# Patient Record
Sex: Female | Born: 1960
Health system: Southern US, Community
[De-identification: ages and names within clinical notes are randomized; demographics above are authoritative.]

## PROBLEM LIST (undated history)

## (undated) DIAGNOSIS — D869 Sarcoidosis, unspecified: Secondary | ICD-10-CM

## (undated) DIAGNOSIS — E039 Hypothyroidism, unspecified: Secondary | ICD-10-CM

## (undated) DIAGNOSIS — R001 Bradycardia, unspecified: Secondary | ICD-10-CM

## (undated) DIAGNOSIS — M1990 Unspecified osteoarthritis, unspecified site: Secondary | ICD-10-CM

## (undated) DIAGNOSIS — E1129 Type 2 diabetes mellitus with other diabetic kidney complication: Secondary | ICD-10-CM

## (undated) DIAGNOSIS — E7800 Pure hypercholesterolemia, unspecified: Secondary | ICD-10-CM

## (undated) DIAGNOSIS — G4730 Sleep apnea, unspecified: Secondary | ICD-10-CM

## (undated) DIAGNOSIS — R519 Headache, unspecified: Secondary | ICD-10-CM

## (undated) DIAGNOSIS — K219 Gastro-esophageal reflux disease without esophagitis: Secondary | ICD-10-CM

## (undated) DIAGNOSIS — E785 Hyperlipidemia, unspecified: Secondary | ICD-10-CM

## (undated) DIAGNOSIS — F32A Depression, unspecified: Secondary | ICD-10-CM

## (undated) DIAGNOSIS — D649 Anemia, unspecified: Secondary | ICD-10-CM

## (undated) DIAGNOSIS — E669 Obesity, unspecified: Secondary | ICD-10-CM

## (undated) DIAGNOSIS — J45909 Unspecified asthma, uncomplicated: Secondary | ICD-10-CM

## (undated) DIAGNOSIS — I10 Essential (primary) hypertension: Secondary | ICD-10-CM

## (undated) DIAGNOSIS — N19 Unspecified kidney failure: Secondary | ICD-10-CM

## (undated) DIAGNOSIS — J449 Chronic obstructive pulmonary disease, unspecified: Secondary | ICD-10-CM

## (undated) DIAGNOSIS — D631 Anemia in chronic kidney disease: Secondary | ICD-10-CM

## (undated) DIAGNOSIS — R011 Cardiac murmur, unspecified: Secondary | ICD-10-CM

## (undated) DIAGNOSIS — E119 Type 2 diabetes mellitus without complications: Secondary | ICD-10-CM

## (undated) DIAGNOSIS — N189 Chronic kidney disease, unspecified: Secondary | ICD-10-CM

## (undated) HISTORY — DX: Anemia in chronic kidney disease: N18.9

## (undated) HISTORY — DX: Bradycardia, unspecified: R00.1

## (undated) HISTORY — DX: Hypothyroidism, unspecified: E03.9

## (undated) HISTORY — DX: Unspecified kidney failure: N19

## (undated) HISTORY — DX: Depression, unspecified: F32.A

## (undated) HISTORY — DX: Obesity, unspecified: E66.9

## (undated) HISTORY — DX: Unspecified asthma, uncomplicated: J45.909

## (undated) HISTORY — DX: Headache, unspecified: R51.9

## (undated) HISTORY — DX: Type 2 diabetes mellitus with other diabetic kidney complication: E11.29

## (undated) HISTORY — DX: Hyperlipidemia, unspecified: E78.5

## (undated) HISTORY — DX: Sleep apnea, unspecified: G47.30

## (undated) HISTORY — DX: Anemia in chronic kidney disease: D63.1

## (undated) HISTORY — DX: Cardiac murmur, unspecified: R01.1

## (undated) HISTORY — DX: Type 2 diabetes mellitus without complications: E11.9

## (undated) HISTORY — DX: Chronic obstructive pulmonary disease, unspecified: J44.9

## (undated) HISTORY — PX: CHOLECYSTECTOMY: SHX55

## (undated) HISTORY — PX: DILATION AND CURETTAGE OF UTERUS: SHX78

---

## 2001-03-19 ENCOUNTER — Encounter: Payer: Self-pay | Admitting: Internal Medicine

## 2001-03-19 ENCOUNTER — Emergency Department (HOSPITAL_COMMUNITY): Admission: EM | Admit: 2001-03-19 | Discharge: 2001-03-19 | Payer: Self-pay | Admitting: Emergency Medicine

## 2009-04-03 ENCOUNTER — Ambulatory Visit: Payer: Self-pay | Admitting: Thoracic Surgery

## 2009-04-09 ENCOUNTER — Encounter: Payer: Self-pay | Admitting: Thoracic Surgery

## 2009-04-09 ENCOUNTER — Ambulatory Visit: Payer: Self-pay | Admitting: Thoracic Surgery

## 2009-04-09 ENCOUNTER — Ambulatory Visit (HOSPITAL_COMMUNITY): Admission: RE | Admit: 2009-04-09 | Discharge: 2009-04-09 | Payer: Self-pay | Admitting: Thoracic Surgery

## 2009-04-10 ENCOUNTER — Ambulatory Visit: Payer: Self-pay | Admitting: Thoracic Surgery

## 2009-05-01 ENCOUNTER — Ambulatory Visit: Payer: Self-pay | Admitting: Thoracic Surgery

## 2009-10-01 ENCOUNTER — Ambulatory Visit (HOSPITAL_COMMUNITY): Admission: RE | Admit: 2009-10-01 | Discharge: 2009-10-01 | Payer: Self-pay | Admitting: Specialist

## 2010-09-17 LAB — GLUCOSE, CAPILLARY: Glucose-Capillary: 104 mg/dL — ABNORMAL HIGH (ref 70–99)

## 2010-10-02 LAB — COMPREHENSIVE METABOLIC PANEL
AST: 24 U/L (ref 0–37)
Albumin: 3.3 g/dL — ABNORMAL LOW (ref 3.5–5.2)
BUN: 10 mg/dL (ref 6–23)
Chloride: 101 mEq/L (ref 96–112)
Creatinine, Ser: 0.73 mg/dL (ref 0.4–1.2)
GFR calc Af Amer: >60 mL/min (ref >60)
Total Protein: 6.7 g/dL (ref 6.0–8.3)

## 2010-10-02 LAB — CBC
HCT: 38.5 % (ref 36.0–46.0)
Hemoglobin: 12.9 g/dL (ref 12.0–15.0)
MCHC: 33.5 g/dL (ref 30.0–36.0)
MCV: 87.6 fL (ref 78.0–100.0)
Platelets: 261 10*3/uL (ref 150–400)
RBC: 4.4 MIL/uL (ref 3.87–5.11)
RDW: 15 % (ref 11.5–15.5)
WBC: 7.3 10*3/uL (ref 4.0–10.5)

## 2010-10-02 LAB — TYPE AND SCREEN
ABO/RH(D): O POS
Antibody Screen: NEGATIVE

## 2010-10-02 LAB — FUNGUS CULTURE W SMEAR: Fungal Smear: NONE SEEN

## 2010-10-02 LAB — ABO/RH: ABO/RH(D): O POS

## 2010-10-02 LAB — AFB CULTURE WITH SMEAR (NOT AT ARMC)

## 2010-10-02 LAB — PROTIME-INR: INR: 0.94 (ref 0.00–1.49)

## 2010-10-02 LAB — APTT: aPTT: 28 seconds (ref 24–37)

## 2010-10-02 LAB — GLUCOSE, CAPILLARY
Glucose-Capillary: 104 mg/dL — ABNORMAL HIGH (ref 70–99)
Glucose-Capillary: 119 mg/dL — ABNORMAL HIGH (ref 70–99)

## 2010-10-02 LAB — CULTURE, RESPIRATORY W GRAM STAIN

## 2010-11-11 NOTE — Letter (Signed)
May 01, 2009   Tanvir A. Chodri, MD  [address] [place] [address]   Re:  GLADA, WICKSTROM                DOB:  12-18-1960   Dear Dr. Blenda Nicely:   I saw the patient back today.  Her mediastinoscopy site is now well  healed.  Her blood pressure was 130/80, pulse 85, respirations 18, and  sats were 98% on 2 L.  She has been started on oxygen by you.  Her  biopsy, as you know, showed sarcoidosis and we gave her a copy of her  path report.  I will be happy to see her again if she needs any further  biopsies.   Sincerely,   Ines Bloomer, M.D.  Electronically Signed   DPB/MEDQ  D:  05/01/2009  T:  05/02/2009  Job:  [id number]

## 2010-11-11 NOTE — Letter (Signed)
April 10, 2009   Tanvir A. Chodri, MD  [address], [place] [address]   Re:  JESICCA, DIPIERRO                DOB:  26-Jun-1961   Dear Dr. Blenda Nicely;   I saw the patient back today after her bronchoscopy, endobronchial  ultrasound, and mediastinoscopy.  We did multiple biopsies and some of  the lymph nodes were completely did show sinus histiocytosis with  several showed noncaseating granuloma, so I feel she does have sarcoid.  Her mediastinoscopy incision was healing well with some swelling, and I  told her to see you regarding treatment, and I will see her back again  in 3 weeks to check on the healing of the mediastinoscopy site.  Her  blood pressure is 120/80, pulse 92, respirations 18, sats were 93%.   Ines Bloomer, M.D.  Electronically Signed   DPB/MEDQ  D:  04/10/2009  T:  04/11/2009  Job:  [id number]

## 2010-11-11 NOTE — Letter (Signed)
April 03, 2009   Tanvir A. Chodri, MD  [address], [place] [address]   Re:  Mackenzie Johnson, Mackenzie Johnson                DOB:  25-Aug-1960   Dear Dr. Blenda Nicely:   I appreciate the opportunity of seeing the patient.  This 50 year old  patient was found to have some mediastinal, but particularly bilateral  hilar adenopathy.  She also has some pulmonary nodules in the left lower  lobe that are nonspecific and bilateral lower lobe bronchiectasis.  She  has had multiple pulmonary problems and was in [facility] for 2 weeks mainly because of pulmonary problems.  She also has  probably obstructive sleep apnea.  She has only smoked for 40 years.  She is referred here for her pulmonary adenopathy.  Her pulmonary  function shows an FVC of 1.75 with an FEV-1 of 1.52 and a diffusion  capacity of 65%.   PAST MEDICAL HISTORY:   ALLERGIES:  She is allergic to sulfa drugs.   MEDICATIONS:  Patanase nasal spray, Qvar, ReQuip, metformin, aspirin,  Crestor, Nexium, fish oil, citalopram, clonazepam, hydrocodone, and  Phenergan as well as being on CPAP.   She has diabetes mellitus type 2, arthritis, hypercholesterolemia,  chronic back pain, anxiety, depression, and GERD; and cholecystectomy.   FAMILY HISTORY:  Positive for diabetes, cancer, cardiac disease, and  hypertension.   SOCIAL HISTORY:  She is single.  She quit smoking.  She only smoked for  4 years.  Does not drink alcohol.   REVIEW OF SYSTEMS:  GENERAL:  She is 226 pounds.  She is 5 feet 4  inches, weight has been stable.  CARDIAC:  No angina or atrial fibrillation, but she has got shortness of  breath.  PULMONARY:  Shortness of breath and cough.  No hemoptysis.  GI:  GERD.  GU:  No kidney disease, dysuria, or frequent urination.  VASCULAR:  No claudication, DVT, or TIAs, but she has pain in her legs  with walking.  NEUROLOGIC:  No dizziness, headaches, blackouts, or seizures.  MUSCULOSKELETAL:  Arthritis.  PSYCHIATRIC:   Depression and nervousness.  EYE/ENT:  No change in eyesight or hearing.  HEMATOLOGIC:  No problems with bleeding, clotting disorders, or anemia.   PHYSICAL EXAMINATION:  General:  She is an obese Caucasian female in no  acute distress.  Vital Signs:  Her blood pressure was 128/80, pulse 80,  respirations 18, and sats were 96%.  Head, Eyes, Ears, Nose and Throat:  Unremarkable.  Neck:  Supple without thyromegaly.  There is no  supraclavicular or axillary adenopathy.  Chest:  Clear to auscultation  and percussion.  Heart:  Regular sinus rhythm.  No murmurs.  Abdomen:  Obese.  Bowel sounds are normal.  Extremities:  Pulses are 2+.  There is  no clubbing or edema.  Neurologic:  She is oriented x3.  Sensory and  motor intact.  Cranial nerves intact.   I think, she probably does have the high possibility of having sarcoid.  I plan to do a bronchoscopy with endobronchial ultrasound and possibly  mediastinoscopy on her and we have tentatively scheduled this on the  13th at [facility].  We will let you know our findings.  I appreciate  the opportunity of seeing the patient.   Sincerely,   Ines Bloomer, M.D.  Electronically Signed   DPB/MEDQ  D:  04/03/2009  T:  04/04/2009  Job:  [id number]

## 2011-09-24 ENCOUNTER — Emergency Department (HOSPITAL_COMMUNITY)
Admission: EM | Admit: 2011-09-24 | Discharge: 2011-09-24 | Disposition: A | Discharge status: Home / Self Care | Payer: Medicare Other | Attending: Emergency Medicine | Admitting: Emergency Medicine

## 2011-09-24 ENCOUNTER — Encounter (HOSPITAL_COMMUNITY): Payer: Self-pay | Admitting: *Deleted

## 2011-09-24 ENCOUNTER — Emergency Department (HOSPITAL_COMMUNITY): Payer: Medicare Other

## 2011-09-24 DIAGNOSIS — J45909 Unspecified asthma, uncomplicated: Secondary | ICD-10-CM | POA: Insufficient documentation

## 2011-09-24 DIAGNOSIS — M25559 Pain in unspecified hip: Secondary | ICD-10-CM | POA: Insufficient documentation

## 2011-09-24 DIAGNOSIS — E119 Type 2 diabetes mellitus without complications: Secondary | ICD-10-CM | POA: Insufficient documentation

## 2011-09-24 DIAGNOSIS — IMO0001 Reserved for inherently not codable concepts without codable children: Secondary | ICD-10-CM | POA: Insufficient documentation

## 2011-09-24 DIAGNOSIS — M25551 Pain in right hip: Secondary | ICD-10-CM

## 2011-09-24 DIAGNOSIS — I10 Essential (primary) hypertension: Secondary | ICD-10-CM | POA: Insufficient documentation

## 2011-09-24 DIAGNOSIS — Z794 Long term (current) use of insulin: Secondary | ICD-10-CM | POA: Insufficient documentation

## 2011-09-24 HISTORY — DX: Pure hypercholesterolemia, unspecified: E78.00

## 2011-09-24 HISTORY — DX: Sarcoidosis, unspecified: D86.9

## 2011-09-24 HISTORY — DX: Essential (primary) hypertension: I10

## 2011-09-24 HISTORY — DX: Gastro-esophageal reflux disease without esophagitis: K21.9

## 2011-09-24 HISTORY — DX: Unspecified osteoarthritis, unspecified site: M19.90

## 2011-09-24 MED ORDER — HYDROCODONE-ACETAMINOPHEN 5-325 MG PO TABS
2.0000 | ORAL_TABLET | Freq: Once | ORAL | Status: AC
  Administered 2011-09-24: 2 via ORAL
  Filled 2011-09-24: qty 2

## 2011-09-24 MED ORDER — HYDROCODONE-ACETAMINOPHEN 5-325 MG PO TABS: 2.0000 | ORAL_TABLET | ORAL | Status: AC | PRN

## 2011-09-24 NOTE — ED Notes (Signed)
Made high fall risk d/t multiple falls at home in last month, pt reports: unsteady on feet, sometimes uses walker at home, and obesity with current hip & leg pain.

## 2011-09-24 NOTE — ED Notes (Signed)
Patient presents to ed c/o right hip pain  States it woke her up 3am today, states she "popped" right hip 1 week ago however was able to ambulate, states her hips hurt her at times and she uses a cane to walk, when they are hurting. Patient is on 4L/O2 at home at all times. Patient was able to ambulate from wheelchair to stretcher with little assistance. Family at bedside.

## 2011-09-24 NOTE — ED Notes (Signed)
Patient returned from xray.

## 2011-09-24 NOTE — ED Provider Notes (Signed)
History     CSN: [id number]  Arrival date & time 09/24/11  8295   First MD Initiated Contact with Patient 09/24/11 [phone]      Chief Complaint  Patient presents with  . Hip Pain    (Consider location/radiation/quality/duration/timing/severity/associated sxs/prior treatment) Patient is a 51 y.o. female presenting with hip pain. The history is provided by the patient. No language interpreter was used.  Hip Pain This is a new problem. The current episode started in the past 7 days. The problem occurs constantly. The problem has been gradually worsening. Associated symptoms include myalgias. Pertinent negatives include no joint swelling. The symptoms are aggravated by walking. She has tried rest for the symptoms. The treatment provided no relief.  Pt reports she feels like her right hip is popping in and out of joint.  Pt complains of pain.    Past Medical History  Diagnosis Date  . Diabetes mellitus   . Hypertension   . Hypercholesteremia   . GERD (gastroesophageal reflux disease)   . Sarcoidosis   . Asthma   . Arthritis     Past Surgical History  Procedure Date  . Cholecystectomy     No family history on file.  History  Substance Use Topics  . Smoking status: Never Smoker   . Smokeless tobacco: Not on file  . Alcohol Use: No    OB History    Grav Para Term Preterm Abortions TAB SAB Ect Mult Living                  Review of Systems  Musculoskeletal: Positive for myalgias and gait problem. Negative for joint swelling.  All other systems reviewed and are negative.    Allergies  Review of patient's allergies indicates no known allergies.  Home Medications   Current Outpatient Rx  Name Route Sig Dispense Refill  . ALBUTEROL SULFATE (2.5 MG/3ML) 0.083% IN NEBU Nebulization Take 2.5 mg by nebulization 2 (two) times daily as needed. Usually uses at bedtime, occasionally has a treatment during the day    . ALPRAZOLAM 1 MG PO TABS Oral Take 3 mg by mouth at bedtime  as needed. anxiety    . BUDESONIDE-FORMOTEROL FUMARATE 160-4.5 MCG/ACT IN AERO Inhalation Inhale 2 puffs into the lungs daily.    . ERGOCALCIFEROL 50000 UNITS PO CAPS Oral Take 50,000 Units by mouth once a week. wednesday    . ESOMEPRAZOLE MAGNESIUM 40 MG PO CPDR Oral Take 40 mg by mouth daily after lunch daily after lunch.    . FUROSEMIDE 40 MG PO TABS Oral Take 40 mg by mouth daily.    . INSULIN GLARGINE 100 UNIT/ML [place] SOLN Subcutaneous Inject 50 Units into the skin at bedtime.    Marland Kitchen LISINOPRIL 20 MG PO TABS Oral Take 20 mg by mouth daily.    Marland Kitchen METFORMIN HCL 1000 MG PO TABS Oral Take 1,000 mg by mouth 2 (two) times daily with a meal.    . OLOPATADINE HCL 0.6 % NA SOLN Each Nare Place 1 puff into both nostrils daily.    Marland Kitchen PREDNISONE 20 MG PO TABS Oral Take 20 mg by mouth 2 (two) times daily.    Marland Kitchen ROPINIROLE HCL 5 MG PO TABS Oral Take 5 mg by mouth at bedtime.    Marland Kitchen ROSUVASTATIN CALCIUM 20 MG PO TABS Oral Take 20 mg by mouth daily.    . SERTRALINE HCL 100 MG PO TABS Oral Take 100 mg by mouth 2 (two) times daily.  BP 98/57  Pulse 82  Temp(Src) 98 F (36.7 C) (Oral)  Resp 20  SpO2 99%  Physical Exam  Nursing note and vitals reviewed. Constitutional: She is oriented to person, place, and time. She appears well-developed and well-nourished.  HENT:  Head: Normocephalic and atraumatic.  Eyes: Pupils are equal, round, and reactive to light.  Neck: Normal range of motion.  Cardiovascular: Normal rate and normal heart sounds.   Pulmonary/Chest: Effort normal and breath sounds normal.  Abdominal: Soft.  Musculoskeletal: She exhibits tenderness.  Neurological: She is alert and oriented to person, place, and time. She has normal reflexes.  Skin: Skin is warm and dry.  Psychiatric: She has a normal mood and affect.    ED Course  Procedures (including critical care time)  Labs Reviewed - No data to display Dg Hip Complete Right  09/24/2011  *RADIOLOGY REPORT*  Clinical Data: Chronic  posterior and lateral right hip pain; multiple recent falls.  RIGHT HIP - COMPLETE 2+ VIEW  Comparison: None.  Findings: There is no evidence of fracture or dislocation.  Both femoral heads are seated normally within their respective acetabula.  The proximal right femur appears intact.  No significant degenerative change is appreciated.  Mild sclerotic change is noted at the sacroiliac joints.  The visualized bowel gas pattern is grossly unremarkable in appearance.  IMPRESSION: No evidence of fracture or dislocation.  Original Report Authenticated By: Tonia Ghent, M.D.     No diagnosis found.    MDM  Pt given 2 vicodin.   Xrays no significant degenerative changes.  I advised pt to follow up with Dr. Dion Saucier Orthopaedist for evaluation of hip pain.  Pt given rx for vicodin       Lonia Skinner [place], [place] 09/24/11 [phone]

## 2011-09-24 NOTE — ED Notes (Signed)
Patient transported to X-ray 

## 2011-09-24 NOTE — ED Notes (Signed)
C/o R hip pain, also R leg pain, describes as numb cool and tingly, reports knee & hip have popped out of place and back in to place, h/o similar. Sitting in w/c rates pain 9-10/10. Pt on home O2.

## 2011-09-24 NOTE — Discharge Instructions (Signed)
Arthralgia Arthralgia is joint pain. A joint is a place where two bones meet. Joint pain can happen for many reasons. The joint can be bruised, stiff, infected, or weak from aging. Pain usually goes away after resting and taking medicine for soreness.  HOME CARE  Rest the joint as told by your doctor.   Keep the sore joint raised (elevated) for the first 24 hours.   Put ice on the joint area.   Put ice in a plastic bag.   Place a towel between your skin and the bag.   Leave the ice on for 15 to 20 minutes, 3 to 4 times a day.   Wear your splint, casting, elastic bandage, or sling as told by your doctor.   Only take medicine as told by your doctor. Do not take aspirin.   Use crutches as told by your doctor. Do not put weight on the joint until told to by your doctor.  GET HELP RIGHT AWAY IF:   You have bruising, puffiness (swelling), or more pain.   Your fingers or toes turn blue or start to lose feeling (numb).   Your medicine does not lessen the pain.   Your pain becomes severe.   You have a temperature by mouth above 102 F (38.9 C), not controlled by medicine.   You cannot move or use the joint.  MAKE SURE YOU:   Understand these instructions.   Will watch your condition.   Will get help right away if you are not doing well or get worse.  Document Released: 06/03/2009 Document Revised: 06/04/2011 Document Reviewed: 06/03/2009 [employer] Patient Information 2012 [employer]. 

## 2011-09-24 NOTE — ED Provider Notes (Signed)
Medical screening examination/treatment/procedure(s) were performed by non-physician practitioner and as supervising physician I was immediately available for consultation/collaboration.   Hanley Seamen, MD 09/24/11 [phone]

## 2012-01-15 ENCOUNTER — Encounter (HOSPITAL_COMMUNITY): Payer: Self-pay | Admitting: *Deleted

## 2012-01-15 ENCOUNTER — Emergency Department (HOSPITAL_COMMUNITY)
Admission: EM | Admit: 2012-01-15 | Discharge: 2012-01-15 | Disposition: A | Discharge status: Home / Self Care | Payer: Medicare Other | Attending: Emergency Medicine | Admitting: Emergency Medicine

## 2012-01-15 DIAGNOSIS — Z79899 Other long term (current) drug therapy: Secondary | ICD-10-CM | POA: Insufficient documentation

## 2012-01-15 DIAGNOSIS — D649 Anemia, unspecified: Secondary | ICD-10-CM

## 2012-01-15 DIAGNOSIS — K219 Gastro-esophageal reflux disease without esophagitis: Secondary | ICD-10-CM | POA: Insufficient documentation

## 2012-01-15 DIAGNOSIS — I10 Essential (primary) hypertension: Secondary | ICD-10-CM | POA: Insufficient documentation

## 2012-01-15 DIAGNOSIS — Z794 Long term (current) use of insulin: Secondary | ICD-10-CM | POA: Insufficient documentation

## 2012-01-15 DIAGNOSIS — D869 Sarcoidosis, unspecified: Secondary | ICD-10-CM | POA: Insufficient documentation

## 2012-01-15 DIAGNOSIS — N289 Disorder of kidney and ureter, unspecified: Secondary | ICD-10-CM

## 2012-01-15 DIAGNOSIS — E7800 Pure hypercholesterolemia, unspecified: Secondary | ICD-10-CM | POA: Insufficient documentation

## 2012-01-15 DIAGNOSIS — M129 Arthropathy, unspecified: Secondary | ICD-10-CM | POA: Insufficient documentation

## 2012-01-15 DIAGNOSIS — E669 Obesity, unspecified: Secondary | ICD-10-CM | POA: Insufficient documentation

## 2012-01-15 DIAGNOSIS — N3090 Cystitis, unspecified without hematuria: Secondary | ICD-10-CM

## 2012-01-15 DIAGNOSIS — E119 Type 2 diabetes mellitus without complications: Secondary | ICD-10-CM | POA: Insufficient documentation

## 2012-01-15 HISTORY — DX: Anemia, unspecified: D64.9

## 2012-01-15 LAB — POCT I-STAT, CHEM 8
Calcium, Ion: 1.27 mmol/L — ABNORMAL HIGH (ref 1.12–1.23)
Chloride: 110 mEq/L (ref 96–112)
Creatinine, Ser: 2.2 mg/dL — ABNORMAL HIGH (ref 0.50–1.10)
Glucose, Bld: 112 mg/dL — ABNORMAL HIGH (ref 70–99)
Hemoglobin: 9.5 g/dL — ABNORMAL LOW (ref 12.0–15.0)
Potassium: 4.9 mEq/L (ref 3.5–5.1)

## 2012-01-15 LAB — URINALYSIS, ROUTINE W REFLEX MICROSCOPIC
Ketones, ur: NEGATIVE mg/dL
Nitrite: NEGATIVE
pH: 5.5 (ref 5.0–8.0)

## 2012-01-15 LAB — URINE MICROSCOPIC-ADD ON

## 2012-01-15 MED ORDER — HYDROCODONE-ACETAMINOPHEN 5-325 MG PO TABS: ORAL_TABLET | ORAL | Status: AC

## 2012-01-15 MED ORDER — SODIUM CHLORIDE 0.9 % IV BOLUS (SEPSIS)
1000.0000 mL | Freq: Once | INTRAVENOUS | Status: AC
  Administered 2012-01-15: 1000 mL via INTRAVENOUS

## 2012-01-15 MED ORDER — DEXTROSE 5 % IV SOLN
1.0000 g | Freq: Once | INTRAVENOUS | Status: AC
  Administered 2012-01-15: 1 g via INTRAVENOUS
  Filled 2012-01-15: qty 10

## 2012-01-15 MED ORDER — CEPHALEXIN 500 MG PO CAPS: 500.0000 mg | ORAL_CAPSULE | Freq: Three times a day (TID) | ORAL | Status: AC

## 2012-01-15 NOTE — ED Notes (Signed)
[place], [place] notified of abnormal lab test results 

## 2012-01-15 NOTE — ED Provider Notes (Signed)
History     CSN: [id number]  Arrival date & time 01/15/12  4098   First MD Initiated Contact with Patient 01/15/12 [phone]      Chief Complaint  Patient presents with  . Urinary Frequency  . Flank Pain    (Consider location/radiation/quality/duration/timing/severity/associated sxs/prior treatment) HPI Comments: Patient presents with the chief complaint of urinary frequency, dysuria, and left flank pain that began approximately yesterday around noon. She states that when she was urinating last evening she felt a 'pop' and thinks that her bladder or kidney has 'popped'. She states she has to urinate every several minutes and thinks that she has an infection. She has nausea but no vomiting. She denies fever, chills, URI symptoms, chest pain, shortness of breath, vaginal bleeding or discharge, change in bowel movements, or tremor he swelling. Nothing makes the symptoms better. Urination makes the symptoms worse. Onset was gradual. Course is constant.  Patient is a 51 y.o. female presenting with frequency. The history is provided by the patient.  Urinary Frequency This is a new problem. The current episode started yesterday. The problem has been unchanged. Associated symptoms include nausea. Pertinent negatives include no abdominal pain, chest pain, coughing, fever, headaches, myalgias, rash, sore throat or vomiting. Nothing aggravates the symptoms. She has tried nothing for the symptoms. The treatment provided no relief.    Past Medical History  Diagnosis Date  . Diabetes mellitus   . Hypertension   . Hypercholesteremia   . GERD (gastroesophageal reflux disease)   . Sarcoidosis   . Asthma   . Arthritis   . Anemia     Past Surgical History  Procedure Date  . Cholecystectomy     History reviewed. No pertinent family history.  History  Substance Use Topics  . Smoking status: Never Smoker   . Smokeless tobacco: Not on file  . Alcohol Use: No    OB History    Grav Para Term  Preterm Abortions TAB SAB Ect Mult Living                  Review of Systems  Constitutional: Negative for fever.  HENT: Negative for sore throat and rhinorrhea.   Eyes: Negative for redness.  Respiratory: Negative for cough.   Cardiovascular: Negative for chest pain.  Gastrointestinal: Positive for nausea. Negative for vomiting, abdominal pain and diarrhea.  Genitourinary: Positive for dysuria, frequency and flank pain. Negative for decreased urine volume, vaginal bleeding and vaginal discharge.  Musculoskeletal: Negative for myalgias.  Skin: Negative for rash.  Neurological: Negative for headaches.    Allergies  Sulfa antibiotics  Home Medications   Current Outpatient Rx  Name Route Sig Dispense Refill  . ALBUTEROL SULFATE (2.5 MG/3ML) 0.083% IN NEBU Nebulization Take 2.5 mg by nebulization 2 (two) times daily as needed. Usually uses at bedtime, occasionally has a treatment during the day    . ALPRAZOLAM 1 MG PO TABS Oral Take 3 mg by mouth at bedtime as needed. anxiety    . BUDESONIDE-FORMOTEROL FUMARATE 160-4.5 MCG/ACT IN AERO Inhalation Inhale 2 puffs into the lungs daily.    . ERGOCALCIFEROL 50000 UNITS PO CAPS Oral Take 50,000 Units by mouth once a week. wednesday    . ESOMEPRAZOLE MAGNESIUM 40 MG PO CPDR Oral Take 40 mg by mouth daily after lunch daily after lunch.    . FUROSEMIDE 40 MG PO TABS Oral Take 40 mg by mouth daily.    . INSULIN GLARGINE 100 UNIT/ML [place] SOLN Subcutaneous Inject 50 Units into the  skin at bedtime.    Marland Kitchen LISINOPRIL 20 MG PO TABS Oral Take 20 mg by mouth daily.    Marland Kitchen METFORMIN HCL 1000 MG PO TABS Oral Take 1,000 mg by mouth 2 (two) times daily with a meal.    . OLOPATADINE HCL 0.6 % NA SOLN Each Nare Place 1 puff into both nostrils daily.    Marland Kitchen PREDNISONE 20 MG PO TABS Oral Take 20 mg by mouth 2 (two) times daily.    Marland Kitchen ROPINIROLE HCL 5 MG PO TABS Oral Take 5 mg by mouth at bedtime.    Marland Kitchen ROSUVASTATIN CALCIUM 20 MG PO TABS Oral Take 20 mg by mouth daily.     . SERTRALINE HCL 100 MG PO TABS Oral Take 100 mg by mouth 2 (two) times daily.      BP 114/47  Pulse 101  Temp 98.8 F (37.1 C) (Oral)  Resp 20  SpO2 98%  Physical Exam  Nursing note and vitals reviewed. Constitutional: She appears well-developed and well-nourished.  HENT:  Head: Normocephalic and atraumatic.  Eyes: Conjunctivae are normal. Right eye exhibits no discharge. Left eye exhibits no discharge.  Neck: Normal range of motion. Neck supple.  Cardiovascular: Normal rate and regular rhythm.   Murmur (systolic) heard. Pulmonary/Chest: Effort normal and breath sounds normal. No respiratory distress.  Abdominal: Soft. There is tenderness in the suprapubic area. There is no rigidity, no rebound, no guarding, no CVA tenderness, no tenderness at McBurney's point and negative Murphy's sign.         Obese, exam limited by habitus  Neurological: She is alert.  Skin: Skin is warm and dry.  Psychiatric: She has a normal mood and affect.    ED Course  Procedures (including critical care time)  Labs Reviewed  URINALYSIS, ROUTINE W REFLEX MICROSCOPIC - Abnormal; Notable for the following:    APPearance HAZY (*)     Hgb urine dipstick MODERATE (*)     Protein, ur 30 (*)     Leukocytes, UA MODERATE (*)     All other components within normal limits  URINE MICROSCOPIC-ADD ON - Abnormal; Notable for the following:    Squamous Epithelial / LPF FEW (*)     Bacteria, UA FEW (*)     Casts HYALINE CASTS (*)  GRANULAR CAST   All other components within normal limits  POCT I-STAT, CHEM 8 - Abnormal; Notable for the following:    BUN 46 (*)     Creatinine, Ser 2.20 (*)     Glucose, Bld 112 (*)     Calcium, Ion 1.27 (*)     Hemoglobin 9.5 (*)     HCT 28.0 (*)     All other components within normal limits  URINE CULTURE   No results found.   1. Cystitis   2. Anemia   3. Renal insufficiency     6:52 AM Patient seen and examined. Work-up initiated. Patient does not want nausea  medication.   Vital signs reviewed and are as follows: Filed Vitals:   01/15/12 0644  BP: 114/47  Pulse: 101  Temp:   Resp: 20   8:54 AM Patient informed of results. Results discussed with Dr. Jeraldine Loots. Patient does not have history of renal insufficiency, but states she is aware of anemia and is supposed to begin taking iron. She denies GI bleeding, back tarry stools, vaginal bleeding. Will give IV abx and fluids. Patient to follow-up with PCP next week for recheck of urine BUN/Crt.   BP  99/60  Pulse 84  Temp 98.8 F (37.1 C) (Oral)  Resp 20  SpO2 100%  10:25 AM Patient comfortable. She states she wants to go home. Discussed treatment for UTI. Discussed need to follow-up with PCP next week for recheck of urine and renal function. She is to start PO iron today.   The patient was urged to return to the Emergency Department immediately with worsening of current symptoms, worsening abdominal pain, persistent vomiting, blood noted in stools, fever, or any other concerns. The patient verbalized understanding.   Patient counseled on use of narcotic pain medications. Counseled not to combine these medications with others containing tylenol. Urged not to drink alcohol, drive, or perform any other activities that requires focus while taking these medications. The patient verbalizes understanding and agrees with the plan.  MDM  Suprapubic abd pain/increased urinary frequency: UA indicates infection. IV abx given. Patient tolerating PO's, afebrile. Do not suspect pyelo or other intraabdominal etiology.   Renal insufficiency: treated with fluids, BUN/Crt = 20. Uncertain if this is acute or chronic as last records show Crt normal in 2010. She is to follow-up with PCP next week for recheck and agrees to do so.   Anemia: pre-existing and known by PCP per patient. No GI bleeding symptoms. She is to start iron today. No concern for acute bleeding. No orthostasis per patient, no tachycardia, no SOB.  Likely chronic. PCP to monitor.        Renne Crigler, [place] 01/15/12 1030

## 2012-01-15 NOTE — ED Notes (Signed)
Pt c/o urinary frequency and flank pain since yesterday at "lunch time".  Painful urination and left flank pain.  N/v/d as well.

## 2012-01-15 NOTE — ED Provider Notes (Signed)
Medical screening examination/treatment/procedure(s) were performed by non-physician practitioner and as supervising physician I was immediately available for consultation/collaboration.   Sarahann Horrell, MD 01/15/12 1723 

## 2012-01-16 LAB — URINE CULTURE

## 2012-11-04 ENCOUNTER — Other Ambulatory Visit (HOSPITAL_COMMUNITY): Payer: Self-pay | Admitting: Internal Medicine

## 2012-11-04 DIAGNOSIS — I739 Peripheral vascular disease, unspecified: Secondary | ICD-10-CM

## 2012-11-28 ENCOUNTER — Ambulatory Visit (HOSPITAL_COMMUNITY)
Admission: RE | Admit: 2012-11-28 | Discharge: 2012-11-28 | Disposition: A | Discharge status: Home / Self Care | Payer: Medicare Other | Source: Ambulatory Visit | Attending: Cardiovascular Disease | Admitting: Cardiovascular Disease

## 2012-11-28 DIAGNOSIS — I739 Peripheral vascular disease, unspecified: Secondary | ICD-10-CM

## 2012-11-28 NOTE — Progress Notes (Signed)
Arterial Lower Ext. Duplex Completed. Agam Tuohy, RDMS, RVT  

## 2012-12-02 ENCOUNTER — Ambulatory Visit (INDEPENDENT_AMBULATORY_CARE_PROVIDER_SITE_OTHER): Payer: Medicare Other | Admitting: Internal Medicine

## 2012-12-02 ENCOUNTER — Encounter: Payer: Self-pay | Admitting: Internal Medicine

## 2012-12-02 VITALS — BP 118/70 | HR 88 | Ht <= 58 in | Wt 257.0 lb

## 2012-12-02 DIAGNOSIS — N183 Chronic kidney disease, stage 3 unspecified: Secondary | ICD-10-CM

## 2012-12-02 DIAGNOSIS — M79605 Pain in left leg: Secondary | ICD-10-CM

## 2012-12-02 DIAGNOSIS — G2581 Restless legs syndrome: Secondary | ICD-10-CM

## 2012-12-02 DIAGNOSIS — M79609 Pain in unspecified limb: Secondary | ICD-10-CM

## 2012-12-02 DIAGNOSIS — E1169 Type 2 diabetes mellitus with other specified complication: Secondary | ICD-10-CM | POA: Insufficient documentation

## 2012-12-02 DIAGNOSIS — E119 Type 2 diabetes mellitus without complications: Secondary | ICD-10-CM

## 2012-12-02 DIAGNOSIS — M79606 Pain in leg, unspecified: Secondary | ICD-10-CM

## 2012-12-02 DIAGNOSIS — D649 Anemia, unspecified: Secondary | ICD-10-CM | POA: Insufficient documentation

## 2012-12-02 DIAGNOSIS — J449 Chronic obstructive pulmonary disease, unspecified: Secondary | ICD-10-CM

## 2012-12-02 DIAGNOSIS — Z794 Long term (current) use of insulin: Secondary | ICD-10-CM

## 2012-12-02 DIAGNOSIS — E669 Obesity, unspecified: Secondary | ICD-10-CM | POA: Insufficient documentation

## 2012-12-02 DIAGNOSIS — IMO0001 Reserved for inherently not codable concepts without codable children: Secondary | ICD-10-CM

## 2012-12-02 DIAGNOSIS — K219 Gastro-esophageal reflux disease without esophagitis: Secondary | ICD-10-CM

## 2012-12-02 HISTORY — DX: Type 2 diabetes mellitus with other specified complication: E66.9

## 2012-12-02 HISTORY — DX: Restless legs syndrome: G25.81

## 2012-12-02 HISTORY — DX: Chronic kidney disease, stage 3 unspecified: N18.30

## 2012-12-02 HISTORY — DX: Anemia, unspecified: D64.9

## 2012-12-02 HISTORY — DX: Type 2 diabetes mellitus with other specified complication: E11.69

## 2012-12-02 HISTORY — DX: Pain in leg, unspecified: M79.606

## 2012-12-02 NOTE — Progress Notes (Signed)
OFFICE NOTE  Chief Complaint:  Abnormal ABI  Primary Care Physician: Mackenzie Cirri, DO  HPI:  Mackenzie Johnson  a 52 year old patient referred to [place] for evaluation of abnormal ABIs. From the best I can tell without any referral information, the patient has been complaining of upper extremity pain in her left arm and in her legs. She underwent plain ABIs without Dopplers which indicated a decreased ABI in the left. She was, therefore, referred for evaluation of peripheral arterial disease. Her past medical history is significant for obesity, hypertension, diabetes, chronic kidney disease stage III, as well as sarcoidosis on long-standing steroid therapy. She has had a number of cardiac workups including stress tests multiple times at [facility] with a plan, apparently, for cardiac catheterization. However, due to her reduced renal function that has not occurred. She describes pain or heaviness in her legs after walking for some distance which improves with rest. The leg pain is more sharp and not as much of a burning pain; however, it is reminiscent of claudication.  She underwent bilateral arterial Dopplers in our office on 11/28/2012. This demonstrated a normal ABI on the left of 1.0 and 1.1 on the right. Segmental arterial waveforms were multiphasic and there was no evidence of obstruction.  PMHx:  Past Medical History  Diagnosis Date  . Diabetes mellitus   . Hypertension   . Hypercholesteremia   . GERD (gastroesophageal reflux disease)   . Sarcoidosis   . Asthma   . Arthritis   . Anemia     Past Surgical History  Procedure Laterality Date  . Cholecystectomy      FAMHx:  No family history on file.  SOCHx:   reports that she has never smoked. She does not have any smokeless tobacco history on file. She reports that she does not drink alcohol or use illicit drugs.  ALLERGIES:  Allergies  Allergen Reactions  . Sulfa Antibiotics Hives    ROS: Pertinent items are noted  in HPI.  HOME MEDS: Current Outpatient Prescriptions  Medication Sig Dispense Refill  . albuterol (PROVENTIL) (2.5 MG/3ML) 0.083% nebulizer solution Take 2.5 mg by nebulization 2 (two) times daily as needed. Usually uses at bedtime, occasionally has a treatment during the day      . ALPRAZolam (XANAX) 1 MG tablet Take 2 mg by mouth at bedtime as needed. anxiety      . esomeprazole (NEXIUM) 40 MG capsule Take 40 mg by mouth daily after lunch daily after lunch.      . fish oil-omega-3 fatty acids 1000 MG capsule Take 1 g by mouth 2 (two) times daily.      . insulin glargine (LANTUS) 100 UNIT/ML injection Inject 50 Units into the skin at bedtime.      . mirtazapine (REMERON) 15 MG tablet Take 15 mg by mouth at bedtime.      . nitroGLYCERIN (NITROSTAT) 0.4 MG SL tablet Place 0.4 mg under the tongue every 5 (five) minutes as needed for chest pain.      Marland Kitchen Olopatadine HCl (PATANASE) 0.6 % SOLN Place 1 puff into both nostrils daily.      . predniSONE (DELTASONE) 20 MG tablet Take 20 mg by mouth 2 (two) times daily.      . ropinirole (REQUIP) 5 MG tablet Take 5 mg by mouth at bedtime.      . rosuvastatin (CRESTOR) 20 MG tablet Take 40 mg by mouth daily.       . sertraline (ZOLOFT) 100 MG tablet Take 100  mg by mouth daily.       Marland Kitchen tiotropium (SPIRIVA) 18 MCG inhalation capsule Place 18 mcg into inhaler and inhale daily.      . ergocalciferol (VITAMIN D2) 50000 UNITS capsule Take 50,000 Units by mouth once a week. wednesday       No current facility-administered medications for this visit.    LABS/IMAGING: No results found for this or any previous visit (from the past 48 hour(s)). No results found.  VITALS: BP 118/70  Pulse 88  Ht 4\' 9"  (1.448 m)  Wt 257 lb (116.574 kg)  BMI 55.6 kg/m2  EXAM: deferred  EKG: deferred  ASSESSMENT: 1. Normal arterial Dopplers 2. Probable peripheral neuropathy  PLAN: 1.   Mackenzie Johnson' arterial Dopplers are normal. There is no evidence of acute blood flow  limitation. I suspect that the pain in her legs are due to neuropathy, especially given her history of restless legs and insulin-dependent diabetes. He may benefit from a neuropathic pain medicine such as gabapentin or Lyrica. No further workup for PAT is indicated at this time. Thank you for allow [place] to participate in her care. She can followup as needed.  Mackenzie Nose, MD, [facility] Attending Cardiologist [facility]  Mackenzie Johnson,Mackenzie Johnson 12/02/2012, 11:11 AM

## 2012-12-02 NOTE — Patient Instructions (Addendum)
Follow up with Dr Rennis Golden only as needed

## 2013-02-22 ENCOUNTER — Encounter: Payer: Self-pay | Admitting: Emergency Medicine

## 2013-02-22 ENCOUNTER — Ambulatory Visit (INDEPENDENT_AMBULATORY_CARE_PROVIDER_SITE_OTHER): Payer: Medicare Other | Admitting: Emergency Medicine

## 2013-02-22 VITALS — BP 100/62 | HR 93 | Temp 98.9°F | Ht <= 58 in | Wt 244.8 lb

## 2013-02-22 DIAGNOSIS — G4733 Obstructive sleep apnea (adult) (pediatric): Secondary | ICD-10-CM | POA: Insufficient documentation

## 2013-02-22 DIAGNOSIS — E662 Morbid (severe) obesity with alveolar hypoventilation: Secondary | ICD-10-CM

## 2013-02-22 DIAGNOSIS — J449 Chronic obstructive pulmonary disease, unspecified: Secondary | ICD-10-CM

## 2013-02-22 DIAGNOSIS — R590 Localized enlarged lymph nodes: Secondary | ICD-10-CM

## 2013-02-22 DIAGNOSIS — R599 Enlarged lymph nodes, unspecified: Secondary | ICD-10-CM

## 2013-02-22 DIAGNOSIS — G4730 Sleep apnea, unspecified: Secondary | ICD-10-CM | POA: Insufficient documentation

## 2013-02-22 DIAGNOSIS — D869 Sarcoidosis, unspecified: Secondary | ICD-10-CM | POA: Insufficient documentation

## 2013-02-22 HISTORY — DX: Morbid (severe) obesity with alveolar hypoventilation: E66.2

## 2013-02-22 HISTORY — DX: Obstructive sleep apnea (adult) (pediatric): G47.33

## 2013-02-22 NOTE — Progress Notes (Signed)
Subjective:    Patient ID: Mackenzie Johnson, female    DOB: Feb 25, 1961, 52 y.o.   MRN: [id number]  HPI 52 year old woman, remote smoker (small exposure), with reported history of sarcoidosis made by CT scan and PET with non-hypermetabolic mediastinal LAD and some scattered nodular disease. No biopsy performed.  Hx of asthma based on PFT's done by Dr Blenda Nicely in [place].  She was on pred after initial dx made, was for ~ 4 yrs. Now tapered to off.   Also with a history of hypertension, diabetes, GERD, obstructive sleep apnea on CPAP, allergies > allergic to grasses and pollen, shots were considered but never done. Uses flonase qd. She has had trouble wearing her CPAP for last 2 weeks since dental work. Planning to restart. She is on O2 24x7, currently on 4L/min at all times.    Review of Systems  Constitutional: Negative for fever and unexpected weight change.  HENT: Positive for congestion and sneezing. Negative for ear pain, nosebleeds, sore throat, rhinorrhea, trouble swallowing, dental problem, postnasal drip and sinus pressure.   Eyes: Negative for redness and itching.  Respiratory: Positive for cough and shortness of breath. Negative for chest tightness and wheezing.   Cardiovascular: Positive for chest pain and leg swelling. Negative for palpitations.  Gastrointestinal: Negative for nausea and vomiting.  Genitourinary: Negative for dysuria.  Musculoskeletal: Negative for joint swelling.  Skin: Negative for rash.  Neurological: Negative for headaches.  Hematological: Does not bruise/bleed easily.  Psychiatric/Behavioral: Positive for dysphoric mood. The patient is not nervous/anxious.    Past Medical History  Diagnosis Date  . Diabetes mellitus   . Hypertension   . Hypercholesteremia   . GERD (gastroesophageal reflux disease)   . Sarcoidosis   . Asthma   . Arthritis   . Anemia   . Sleep apnea     CPAP     Family History  Problem Relation Age of Onset  . Heart disease Mother    . Stroke Mother   . Stroke Father   . Cancer Father     prostate     History   Social History  . Marital Status: Divorced    Spouse Name: N/A    Number of Children: 0  . Years of Education: N/A   Occupational History  . disability     pulmonary/depression   Social History Main Topics  . Smoking status: Never Smoker   . Smokeless tobacco: Never Used  . Alcohol Use: No  . Drug Use: No  . Sexual Activity: Not on file   Other Topics Concern  . Not on file   Social History Narrative  . No narrative on file     Allergies  Allergen Reactions  . Sulfa Antibiotics Hives     Outpatient Prescriptions Prior to Visit  Medication Sig Dispense Refill  . albuterol (PROVENTIL) (2.5 MG/3ML) 0.083% nebulizer solution Take 2.5 mg by nebulization 2 (two) times daily as needed. Usually uses at bedtime, occasionally has a treatment during the day      . esomeprazole (NEXIUM) 40 MG capsule Take 40 mg by mouth daily after lunch daily after lunch.      . insulin glargine (LANTUS) 100 UNIT/ML injection Inject 50 Units into the skin at bedtime.      . nitroGLYCERIN (NITROSTAT) 0.4 MG SL tablet Place 0.4 mg under the tongue every 5 (five) minutes as needed for chest pain.      . ropinirole (REQUIP) 5 MG tablet Take 5 mg by mouth at  bedtime.      . rosuvastatin (CRESTOR) 20 MG tablet Take 40 mg by mouth daily.       Marland Kitchen tiotropium (SPIRIVA) 18 MCG inhalation capsule Place 18 mcg into inhaler and inhale daily.      Marland Kitchen ALPRAZolam (XANAX) 1 MG tablet Take 2 mg by mouth at bedtime as needed. anxiety      . ergocalciferol (VITAMIN D2) 50000 UNITS capsule Take 50,000 Units by mouth once a week. wednesday      . fish oil-omega-3 fatty acids 1000 MG capsule Take 1 g by mouth 2 (two) times daily.      . mirtazapine (REMERON) 15 MG tablet Take 15 mg by mouth at bedtime.      . Olopatadine HCl (PATANASE) 0.6 % SOLN Place 1 puff into both nostrils daily.      . predniSONE (DELTASONE) 20 MG tablet Take 20 mg  by mouth 2 (two) times daily.      . sertraline (ZOLOFT) 100 MG tablet Take 100 mg by mouth daily.        No facility-administered medications prior to visit.       Objective:   Physical Exam Filed Vitals:   02/22/13 1030  BP: 100/62  Pulse: 93  Temp: 98.9 F (37.2 C)  TempSrc: Oral  Height: 4\' 9"  (1.448 m)  Weight: 244 lb 12.8 oz (111.041 kg)  SpO2: 98%   Gen: Pleasant, obese, in no distress,  normal affect  ENT: No lesions,  mouth clear,  oropharynx clear, no postnasal drip  Neck: No JVD, no TMG, no carotid bruits  Lungs: No use of accessory muscles, no dullness to percussion, clear without rales or rhonchi  Cardiovascular: RRR, heart sounds normal, no murmur or gallops, no peripheral edema  Musculoskeletal: No deformities, no cyanosis or clubbing  Neuro: alert, non focal  Skin: Warm, no lesions or rashes     Assessment & Plan:  COPD (chronic obstructive pulmonary disease) Presumed diagnosis due to chronic obstruction from possible sarcoidosis. She is a never smoker.  - Will obtain PFTs - Continue Spiriva for now  Mediastinal lymphadenopathy It sounds like a presumptive diagnosis of sarcoidosis was made based on CT scan appearance and a negative PET scan that was performed in 2011. No biopsy of the lung or scan has been done.  - Will obtain records from [facility] - Consider repeat CT scan of the chest to assess her lymphadenopathy at this time - A stone results of her repeat imaging we may need to consider biopsy to make a true diagnosis  Obesity hypoventilation syndrome - daytime hypoxemia, continue oxygen as ordered  OSA (obstructive sleep apnea) - restart CPAP when she has recovered from her dental extractions - She may need a new mask if her fit changes now that her teeth have ben pulled

## 2013-02-22 NOTE — Assessment & Plan Note (Signed)
-   restart CPAP when she has recovered from her dental extractions - She may need a new mask if her fit changes now that her teeth have ben pulled

## 2013-02-22 NOTE — Patient Instructions (Addendum)
We will obtain your records including pulmonary function testing, sleep study,  office notes from [place].  Please continue your Spiriva and albuterol as you're taking them Increase fluticasone nasal spray 2 one spray each nostril twice a day Restart your CPAP when possible Follow with Dr Delton Coombes in 1 month

## 2013-02-22 NOTE — Assessment & Plan Note (Signed)
It sounds like a presumptive diagnosis of sarcoidosis was made based on CT scan appearance and a negative PET scan that was performed in 2011. No biopsy of the lung or scan has been done.  - Will obtain records from [facility] - Consider repeat CT scan of the chest to assess her lymphadenopathy at this time - A stone results of her repeat imaging we may need to consider biopsy to make a true diagnosis

## 2013-02-22 NOTE — Assessment & Plan Note (Signed)
-   daytime hypoxemia, continue oxygen as ordered

## 2013-02-22 NOTE — Assessment & Plan Note (Signed)
Presumed diagnosis due to chronic obstruction from possible sarcoidosis. She is a never smoker.  - Will obtain PFTs - Continue Spiriva for now

## 2013-03-23 ENCOUNTER — Ambulatory Visit (INDEPENDENT_AMBULATORY_CARE_PROVIDER_SITE_OTHER): Payer: Medicare Other | Admitting: Emergency Medicine

## 2013-03-23 ENCOUNTER — Encounter: Payer: Self-pay | Admitting: Emergency Medicine

## 2013-03-23 VITALS — BP 138/86 | HR 90 | Temp 99.2°F | Ht <= 58 in | Wt 240.0 lb

## 2013-03-23 DIAGNOSIS — G4733 Obstructive sleep apnea (adult) (pediatric): Secondary | ICD-10-CM

## 2013-03-23 DIAGNOSIS — R599 Enlarged lymph nodes, unspecified: Secondary | ICD-10-CM

## 2013-03-23 DIAGNOSIS — R590 Localized enlarged lymph nodes: Secondary | ICD-10-CM

## 2013-03-23 DIAGNOSIS — E662 Morbid (severe) obesity with alveolar hypoventilation: Secondary | ICD-10-CM

## 2013-03-23 DIAGNOSIS — J449 Chronic obstructive pulmonary disease, unspecified: Secondary | ICD-10-CM

## 2013-03-23 DIAGNOSIS — J4489 Other specified chronic obstructive pulmonary disease: Secondary | ICD-10-CM

## 2013-03-23 NOTE — Progress Notes (Signed)
  Subjective:    Patient ID: Mackenzie Johnson, female    DOB: Nov 30, 1960, 52 y.o.   MRN: [id number]  HPI 52 year old woman, remote smoker (small exposure), with reported history of sarcoidosis made by CT scan and PET with non-hypermetabolic mediastinal LAD and some scattered nodular disease. No biopsy performed.  Hx of asthma based on PFT's done by Dr Blenda Nicely in [place].  She was on pred after initial dx made, was for ~ 4 yrs. Now tapered to off.   Also with a history of hypertension, diabetes, GERD, obstructive sleep apnea on CPAP, allergies > allergic to grasses and pollen, shots were considered but never done. Uses flonase qd. She has had trouble wearing her CPAP for last 2 weeks since dental work. Planning to restart. She is on O2 24x7, currently on 4L/min at all times.   ROV 03/23/13 -- f/u for hx asthma and possible sarcoidosis although no bx data, OSA on CPAP. She restarted the CPAP as planned but then it broke. Mackenzie Johnson is the company. States that her breathing has been doing a bit better since last visit.    Review of Systems  Constitutional: Negative for fever and unexpected weight change.  HENT: Positive for congestion and sneezing. Negative for ear pain, nosebleeds, sore throat, rhinorrhea, trouble swallowing, dental problem, postnasal drip and sinus pressure.   Eyes: Negative for redness and itching.  Respiratory: Positive for cough and shortness of breath. Negative for chest tightness and wheezing.   Cardiovascular: Positive for chest pain and leg swelling. Negative for palpitations.  Gastrointestinal: Negative for nausea and vomiting.  Genitourinary: Negative for dysuria.  Musculoskeletal: Negative for joint swelling.  Skin: Negative for rash.  Neurological: Negative for headaches.  Hematological: Does not bruise/bleed easily.  Psychiatric/Behavioral: Positive for dysphoric mood. The patient is not nervous/anxious.        Objective:   Physical Exam Filed Vitals:   03/23/13  1014  BP: 138/86  Pulse: 90  Temp: 99.2 F (37.3 C)  TempSrc: Oral  Height: 4\' 9"  (1.448 m)  Weight: 240 lb (108.863 kg)  SpO2: 97%   Gen: Pleasant, obese, in no distress,  normal affect  ENT: No lesions,  mouth clear,  oropharynx clear, no postnasal drip  Neck: No JVD, no TMG, no carotid bruits  Lungs: No use of accessory muscles, no dullness to percussion, clear without rales or rhonchi  Cardiovascular: RRR, heart sounds normal, no murmur or gallops, no peripheral edema  Musculoskeletal: No deformities, no cyanosis or clubbing  Neuro: alert, non focal  Skin: Warm, no lesions or rashes     Assessment & Plan:  OSA (obstructive sleep apnea) Will ask [employer] to repair her CPAP, encouraged compliance.   Obesity hypoventilation syndrome O2 at all times on 3L/min  Mediastinal lymphadenopathy Question sarcoidosis. Waiting for records from [place]. Serial CTs show stable small R paratracheal node, R subcarinal node stable, no nodules.  - will follow clinically and by CXR. Will let these factors determine timing of any repeat CT scan   COPD (chronic obstructive pulmonary disease) - spiriva, albuterol prn

## 2013-03-23 NOTE — Assessment & Plan Note (Signed)
Question sarcoidosis. Waiting for records from [place]. Serial CTs show stable small R paratracheal node, R subcarinal node stable, no nodules.  - will follow clinically and by CXR. Will let these factors determine timing of any repeat CT scan

## 2013-03-23 NOTE — Assessment & Plan Note (Signed)
Will ask [employer] to repair her CPAP, encouraged compliance.

## 2013-03-23 NOTE — Patient Instructions (Addendum)
We will ask [employer] to repair your CPAP Please continue your oxygen and your inhaled medications.  Your CT scan of the chest from 2013 shows stable lymph nodes in the chest. We do not need to repeat your scan at this time but may decide to do so in the future Continue your inhaled medications.  Follow with Dr Delton Coombes in 6 months or sooner if you have any problems

## 2013-03-23 NOTE — Assessment & Plan Note (Signed)
O2 at all times on 3L/min

## 2013-03-23 NOTE — Assessment & Plan Note (Signed)
-   spiriva, albuterol prn

## 2013-04-18 ENCOUNTER — Encounter (INDEPENDENT_AMBULATORY_CARE_PROVIDER_SITE_OTHER): Payer: Self-pay

## 2013-04-18 ENCOUNTER — Ambulatory Visit (INDEPENDENT_AMBULATORY_CARE_PROVIDER_SITE_OTHER): Payer: Medicare Other | Admitting: Podiatrist

## 2013-04-18 ENCOUNTER — Encounter: Payer: Self-pay | Admitting: Podiatrist

## 2013-04-18 VITALS — BP 135/68 | HR 76 | Resp 16

## 2013-04-18 DIAGNOSIS — M2140 Flat foot [pes planus] (acquired), unspecified foot: Secondary | ICD-10-CM

## 2013-04-18 DIAGNOSIS — M2020 Hallux rigidus, unspecified foot: Secondary | ICD-10-CM

## 2013-04-18 DIAGNOSIS — L84 Corns and callosities: Secondary | ICD-10-CM

## 2013-04-18 DIAGNOSIS — E1149 Type 2 diabetes mellitus with other diabetic neurological complication: Secondary | ICD-10-CM

## 2013-04-18 NOTE — Progress Notes (Signed)
  Subjective:    Patient ID: Mackenzie Johnson, female    DOB: 1961/06/18, 52 y.o.   MRN: [id number]  HPI she presents today stating "I am a diabetic and need my feet checked".  Patient denies any active problems with her feet. Denies any pain or tenderness to her feet denies any problems with ambulation.    Review of Systems  Constitutional: Positive for appetite change.       Had all my teeth pulled  HENT:       Ringing of ears   Eyes: Negative.   Respiratory: Negative.   Cardiovascular: Positive for chest pain and palpitations.  Gastrointestinal:       Stool test came back showing bleed in it  Endocrine: Negative.   Genitourinary: Positive for frequency.  Musculoskeletal: Positive for back pain.  Skin: Negative.   Allergic/Immunologic:       Leaves grass pollen and rag weed  Neurological: Positive for dizziness.  Hematological: Bruises/bleeds easily.  Psychiatric/Behavioral:       Depression and bi-polar       Objective:   Physical Exam GENERAL APPEARANCE: Alert, conversant. Appropriately groomed. No acute distress.  VASCULAR: Pedal pulses palpableat 1/4 DP/PT bilateral.  Capillary refill time is immediate to all digits,  Proximal to distal cooling it warm to warm.  Digital hair growth is present bilateral. Diffuse swelling noted to bilateral feet NEUROLOGIC: sensation is intact epicritically and protectively to 5.07 monofilament at 5/5 sites bilateral.  Light touch is intact bilateral, vibratory sensation intact bilateral, achilles tendon reflex is intact bilateral. Subjectively relates numbness and tingling in both feet MUSCULOSKELETAL: acceptable muscle strength, tone and stability bilateral. Pes planus foot deformity noted bilateral.  Hallux limitus is also present with decreased Range of motion at the first metatarsophalangeal joint bilateral. rectus appearance of digits is seen DERMATOLOGIC: Pre-ulcerative lesions/calluses present hallux bilateral.  Generalized swelling has  seen.  No interdigital maceration is noted normal appearance of toenails noted 1 through 5 bilateral.      Assessment & Plan:  Diabetes with early neuropathy, pes planus deformity , hallux limitus bilateral Plan: Discussed diabetic foot health and discuss diabetic shoes- she speaks regularly within insurance presented to his and she's going to call week with him about getting shoes. Be seen back for routine appointments at her request.

## 2013-04-18 NOTE — Patient Instructions (Signed)

## 2013-11-14 ENCOUNTER — Encounter (INDEPENDENT_AMBULATORY_CARE_PROVIDER_SITE_OTHER): Payer: Self-pay

## 2013-11-14 ENCOUNTER — Ambulatory Visit (INDEPENDENT_AMBULATORY_CARE_PROVIDER_SITE_OTHER)
Admission: RE | Admit: 2013-11-14 | Discharge: 2013-11-14 | Disposition: A | Discharge status: Home / Self Care | Payer: Medicare Other | Source: Ambulatory Visit | Attending: Emergency Medicine | Admitting: Emergency Medicine

## 2013-11-14 ENCOUNTER — Ambulatory Visit (INDEPENDENT_AMBULATORY_CARE_PROVIDER_SITE_OTHER): Payer: Medicare Other | Admitting: Emergency Medicine

## 2013-11-14 ENCOUNTER — Encounter: Payer: Self-pay | Admitting: Emergency Medicine

## 2013-11-14 VITALS — BP 122/84 | HR 85 | Ht <= 58 in | Wt 225.0 lb

## 2013-11-14 DIAGNOSIS — J309 Allergic rhinitis, unspecified: Secondary | ICD-10-CM

## 2013-11-14 DIAGNOSIS — G4733 Obstructive sleep apnea (adult) (pediatric): Secondary | ICD-10-CM

## 2013-11-14 DIAGNOSIS — R590 Localized enlarged lymph nodes: Secondary | ICD-10-CM

## 2013-11-14 DIAGNOSIS — J449 Chronic obstructive pulmonary disease, unspecified: Secondary | ICD-10-CM

## 2013-11-14 DIAGNOSIS — R599 Enlarged lymph nodes, unspecified: Secondary | ICD-10-CM

## 2013-11-14 HISTORY — DX: Allergic rhinitis, unspecified: J30.9

## 2013-11-14 MED ORDER — FLUTICASONE PROPIONATE 50 MCG/ACT NA SUSP: 2.0000 | Freq: Two times a day (BID) | NASAL | Status: DC

## 2013-11-14 MED ORDER — LORATADINE 10 MG PO TABS: 10.0000 mg | ORAL_TABLET | Freq: Every day | ORAL | Status: DC

## 2013-11-14 NOTE — Progress Notes (Signed)
  Subjective:    Patient ID: Mackenzie Johnson, female    DOB: 28-Sep-1960, 53 y.o.   MRN: [id number]  HPI 53 year old woman, remote smoker (small exposure), with reported history of sarcoidosis made by CT scan and PET with non-hypermetabolic mediastinal LAD and some scattered nodular disease. No biopsy performed.  Hx of asthma based on PFT's done by Dr Blenda [place]hodri in [place].  She was on pred after initial dx made, was for ~ 4 yrs. Now tapered to off.   Also with a history of hypertension, diabetes, GERD, obstructive sleep apnea on CPAP, allergies > allergic to grasses and pollen, shots were considered but never done. Uses flonase qd. She has had trouble wearing her CPAP for last 2 weeks since dental work. Planning to restart. She is on O2 24x7, currently on 4L/min at all times.   ROV 03/23/13 -- f/u for hx asthma and possible sarcoidosis although no bx data, OSA on CPAP. She restarted the CPAP as planned but then it broke. [employer] the company. States that her breathing has been doing a bit better since last visit.   ROV 11/14/13 -- follows for asthma, ? Sarcoidosis based on CT scan, OSA on CPAP.  Follows today and states that she has had sinus drainage of clear mucous, worse over the last 2 days. She is taking flonase qd.  No flares with abx or pred. She is tolerating CPAP. She is due for a CXR.    Review of Systems  Constitutional: Negative for fever and unexpected weight change.  HENT: Positive for congestion and sneezing. Negative for dental problem, ear pain, nosebleeds, postnasal drip, rhinorrhea, sinus pressure, sore throat and trouble swallowing.   Eyes: Negative for redness and itching.  Respiratory: Positive for cough and shortness of breath. Negative for chest tightness and wheezing.   Cardiovascular: Positive for chest pain and leg swelling. Negative for palpitations.  Gastrointestinal: Negative for nausea and vomiting.  Genitourinary: Negative for dysuria.  Musculoskeletal: Negative for  joint swelling.  Skin: Negative for rash.  Neurological: Negative for headaches.  Hematological: Does not bruise/bleed easily.  Psychiatric/Behavioral: Positive for dysphoric mood. The patient is not nervous/anxious.        Objective:   Physical Exam Filed Vitals:   11/14/13 1108  BP: 122/84  Pulse: 85  Height: 4\' 10"  (1.473 m)  Weight: 225 lb (102.059 kg)  SpO2: 99%   Gen: Pleasant, obese, in no distress,  normal affect  ENT: No lesions,  mouth clear,  oropharynx clear, no postnasal drip  Neck: No JVD, no TMG, no carotid bruits  Lungs: No use of accessory muscles, no dullness to percussion, clear without rales or rhonchi  Cardiovascular: RRR, heart sounds normal, no murmur or gallops, no peripheral edema  Musculoskeletal: No deformities, no cyanosis or clubbing  Neuro: alert, non focal  Skin: Warm, no lesions or rashes     Assessment & Plan:  COPD (chronic obstructive pulmonary disease) - continue spiriva daily - albuterol prn  OSA (obstructive sleep apnea) - CPAP every night  Mediastinal lymphadenopathy - CXR today

## 2013-11-14 NOTE — Patient Instructions (Signed)
CXR today Increase your your flonase to 2 sprays twice a day. We will send a prescription for the generic (fluticasone) Start loratadine 10mg  daily Start nasal saline washes daily. We will give you a recipe for making the wash solution Continue your Spiriva daily Use albuterol as needed.  Wear your CPAP every night Follow with Dr Delton CoombesByrum in 4 months or sooner if you have any problems.

## 2013-11-14 NOTE — Assessment & Plan Note (Signed)
CXR today.  

## 2013-11-14 NOTE — Assessment & Plan Note (Signed)
CPAP every night. °

## 2013-11-14 NOTE — Assessment & Plan Note (Signed)
Moderate control on flonase - increase fluticasone to bid - add loratadine - start [employer]

## 2013-11-14 NOTE — Assessment & Plan Note (Signed)
-   continue spiriva daily - albuterol prn

## 2013-11-21 ENCOUNTER — Telehealth: Payer: Self-pay | Admitting: Emergency Medicine

## 2013-11-21 DIAGNOSIS — J309 Allergic rhinitis, unspecified: Secondary | ICD-10-CM

## 2013-11-21 MED ORDER — FLUTICASONE PROPIONATE 50 MCG/ACT NA SUSP: 2.0000 | Freq: Two times a day (BID) | NASAL | Status: DC

## 2013-11-21 NOTE — Telephone Encounter (Signed)
Flonase rx was sent on 11/14/13 #16 g x 12. Called, spoke with pt who reports this is only enough to last 15 days as RB rec she increase it to 2 puffs bid. Called [facility], spoke with Gaylyn Rong.  Was advised we would need to send in rx for 32 grams to last 30 days. VO given to [place] who verbalized understanding.   Pt aware and voiced no further questions or concerns at this time.

## 2014-03-14 ENCOUNTER — Encounter: Payer: Self-pay | Admitting: Emergency Medicine

## 2014-03-14 ENCOUNTER — Ambulatory Visit (INDEPENDENT_AMBULATORY_CARE_PROVIDER_SITE_OTHER): Payer: Medicare Other | Admitting: Emergency Medicine

## 2014-03-14 VITALS — BP 90/60 | HR 97 | Temp 98.4°F | Ht <= 58 in | Wt 231.0 lb

## 2014-03-14 DIAGNOSIS — G4733 Obstructive sleep apnea (adult) (pediatric): Secondary | ICD-10-CM

## 2014-03-14 DIAGNOSIS — R599 Enlarged lymph nodes, unspecified: Secondary | ICD-10-CM

## 2014-03-14 DIAGNOSIS — Z23 Encounter for immunization: Secondary | ICD-10-CM

## 2014-03-14 DIAGNOSIS — R590 Localized enlarged lymph nodes: Secondary | ICD-10-CM

## 2014-03-14 DIAGNOSIS — J449 Chronic obstructive pulmonary disease, unspecified: Secondary | ICD-10-CM

## 2014-03-14 DIAGNOSIS — J309 Allergic rhinitis, unspecified: Secondary | ICD-10-CM

## 2014-03-14 NOTE — Patient Instructions (Signed)
Please continue Spiriva once a day Use albuterol 2 puffs as needed for shortness of breath Continue your loratadine and fluticasone nasal spray Use nasal washes as needed Wear your oxygen at all times.  Work on wearing your CPAP every night Flu shot today Follow with Dr Delton Coombes in 4 months or sooner if you have any problems.

## 2014-03-14 NOTE — Assessment & Plan Note (Signed)
Noted on CT scans of the chest in the past. Her most recent chest x-ray May 2015 does not show any progressive lymphadenopathy. We'll follow her clinically and plan to repeat her chest x-ray next Spring. If she changes then we will perform a CT scan of her chest

## 2014-03-14 NOTE — Assessment & Plan Note (Signed)
Need to work on CPAP compliance

## 2014-03-14 NOTE — Assessment & Plan Note (Signed)
Continue your loratadine and fluticasone nasal spray Use nasal washes as needed Wear your oxygen at all times.

## 2014-03-14 NOTE — Assessment & Plan Note (Signed)
Please continue Spiriva once a day Use albuterol 2 puffs as needed for shortness of breath Flu shot today Follow with Dr Delton Coombes in 4 months or sooner if you have any problems.

## 2014-03-14 NOTE — Progress Notes (Signed)
Subjective:    Patient ID: Mackenzie Johnson, female    DOB: [birth year], 53 y.o.   MRN: [id number]  HPI 53 year old woman, remote smoker (small exposure), with reported history of sarcoidosis made by CT scan and PET with non-hypermetabolic mediastinal LAD and some scattered nodular disease. No biopsy performed.  Hx of asthma based on PFT's done by Dr Blenda Nicely in [place].  She was on pred after initial dx made, was for ~ 4 yrs. Now tapered to off.   Also with a history of hypertension, diabetes, GERD, obstructive sleep apnea on CPAP, allergies > allergic to grasses and pollen, shots were considered but never done. Uses flonase qd. She has had trouble wearing her CPAP for last 2 weeks since dental work. Planning to restart. She is on O2 24x7, currently on 4L/min at all times.   ROV 03/23/13 -- f/u for hx asthma and possible sarcoidosis although no bx data, OSA on CPAP. She restarted the CPAP as planned but then it broke. [name] is the company. States that her breathing has been doing a bit better since last visit.   ROV 11/14/13 -- follows for asthma, ? Sarcoidosis based on CT scan, OSA on CPAP.  Follows today and states that she has had sinus drainage of clear mucous, worse over the last 2 days. She is taking flonase qd.  No flares with abx or pred. She is tolerating CPAP. She is due for a CXR.   ROV 03/14/14 -- history of asthma and suspected sarcoidosis based on CT scan appearance. Also with sleep apnea on CPAP, marginal compliance lately. She also has allergic rhinitis and is on fluticasone and loratadine. We attempted to start nasal saline washes last time. She has been using these prn. Her cough is improved, continues to have some exertional SOB.  CXR last time stable. Uses spiriva, rarely uses SABA.    Review of Systems  Constitutional: Negative for fever and unexpected weight change.  HENT: Positive for congestion. Negative for dental problem, ear pain, nosebleeds, postnasal drip, rhinorrhea,  sinus pressure, sneezing, sore throat and trouble swallowing.   Eyes: Negative for redness and itching.  Respiratory: Positive for shortness of breath. Negative for cough, chest tightness and wheezing.   Cardiovascular: Negative for chest pain, palpitations and leg swelling.  Gastrointestinal: Negative for nausea and vomiting.  Genitourinary: Negative for dysuria.  Musculoskeletal: Negative for joint swelling.  Skin: Negative for rash.  Neurological: Negative for headaches.  Hematological: Does not bruise/bleed easily.  Psychiatric/Behavioral: Negative for dysphoric mood. The patient is not nervous/anxious.        Objective:   Physical Exam Filed Vitals:   03/14/14 1346  BP: 90/60  Pulse: 97  Temp: 98.4 F (36.9 C)  Height:  (1.448 m)  Weight: 231 lb (104.781 kg)  SpO2: 98%   Gen: Pleasant, obese, in no distress,  normal affect  ENT: No lesions,  mouth clear,  oropharynx clear, no postnasal drip  Neck: No JVD, no TMG, no carotid bruits  Lungs: No use of accessory muscles, no dullness to percussion, clear without rales or rhonchi  Cardiovascular: RRR, heart sounds normal, no murmur or gallops, no peripheral edema  Musculoskeletal: No deformities, no cyanosis or clubbing  Neuro: alert, non focal  Skin: Warm, no lesions or rashes    11/14/13 --  COMPARISON: 09/27/2012  FINDINGS:  Cardiac silhouette is normal in size. Normal mediastinal contour. No  hilar masses or convincing adenopathy.  Left basilar opacity is noted consistent with mild atelectasis.  Lungs are otherwise clear. No pleural effusion. No pneumothorax.  Bony thorax is intact.  IMPRESSION:  No active cardiopulmonary disease      Assessment & Plan:  COPD (chronic obstructive pulmonary disease) Please continue Spiriva once a day Use albuterol 2 puffs as needed for shortness of breath Flu shot today Follow with Dr Delton Coombes in 4 months or sooner if you have any problems.  Allergic  rhinitis Continue your loratadine and fluticasone nasal spray Use nasal washes as needed Wear your oxygen at all times.    OSA (obstructive sleep apnea) Need to work on CPAP compliance  Mediastinal lymphadenopathy Noted on CT scans of the chest in the past. Her most recent chest x-ray May 2015 does not show any progressive lymphadenopathy. We'll follow her clinically and plan to repeat her chest x-ray next Spring. If she changes then we will perform a CT scan of her chest

## 2014-03-26 ENCOUNTER — Other Ambulatory Visit: Payer: Self-pay | Admitting: Emergency Medicine

## 2014-07-04 ENCOUNTER — Other Ambulatory Visit: Payer: Self-pay | Admitting: Emergency Medicine

## 2014-08-17 ENCOUNTER — Encounter: Payer: Self-pay | Admitting: Emergency Medicine

## 2014-08-17 ENCOUNTER — Ambulatory Visit (INDEPENDENT_AMBULATORY_CARE_PROVIDER_SITE_OTHER): Payer: Medicare Other | Admitting: Emergency Medicine

## 2014-08-17 ENCOUNTER — Other Ambulatory Visit: Payer: Self-pay | Admitting: Emergency Medicine

## 2014-08-17 VITALS — BP 132/76 | HR 84 | Ht <= 58 in | Wt 238.0 lb

## 2014-08-17 DIAGNOSIS — J449 Chronic obstructive pulmonary disease, unspecified: Secondary | ICD-10-CM

## 2014-08-17 DIAGNOSIS — G4733 Obstructive sleep apnea (adult) (pediatric): Secondary | ICD-10-CM

## 2014-08-17 DIAGNOSIS — R0602 Shortness of breath: Secondary | ICD-10-CM

## 2014-08-17 DIAGNOSIS — R599 Enlarged lymph nodes, unspecified: Secondary | ICD-10-CM

## 2014-08-17 DIAGNOSIS — R590 Localized enlarged lymph nodes: Secondary | ICD-10-CM

## 2014-08-17 NOTE — Assessment & Plan Note (Signed)
spiriva + SABA prn 

## 2014-08-17 NOTE — Progress Notes (Signed)
Subjective:    Patient ID: Mackenzie Johnson, female    DOB: December 28, 1960, 54 y.o.   MRN: [id number]  HPI 54 year old woman, remote smoker (small exposure), with reported history of sarcoidosis made by CT scan and PET with non-hypermetabolic mediastinal LAD and some scattered nodular disease. No biopsy performed.  Hx of asthma based on PFT's done by Dr Blenda Nicelyhodri in [place].  She was on pred after initial dx made, was for ~ 4 yrs. Now tapered to off.   Also with a history of hypertension, diabetes, GERD, obstructive sleep apnea on CPAP, allergies > allergic to grasses and pollen, shots were considered but never done. Uses flonase qd. She has had trouble wearing her CPAP for last 2 weeks since dental work. Planning to restart. She is on O2 24x7, currently on 4L/min at all times.   ROV 03/23/13 -- f/u for hx asthma and possible sarcoidosis although no bx data, OSA on CPAP. She restarted the CPAP as planned but then it broke. [employer] the [employer]. States that her breathing has been doing a bit better since last visit.   ROV 11/14/13 -- follows for asthma, ? Sarcoidosis based on CT scan, OSA on CPAP.  Follows today and states that she has had sinus drainage of clear mucous, worse over the last 2 days. She is taking flonase qd.  No flares with abx or pred. She is tolerating CPAP. She is due for a CXR.   ROV 03/14/14 -- history of asthma and suspected sarcoidosis based on CT scan appearance. Also with sleep apnea on CPAP, marginal compliance lately. She also has allergic rhinitis and is on fluticasone and loratadine. We attempted to start nasal saline washes last time. She has been using these prn. Her cough is improved, continues to have some exertional SOB.  CXR last time stable. Uses spiriva, rarely uses SABA.    ROV 08/17/14 -- Follow-up visit for asthma and suspected sarcoidosis based on her CT scan of the chest. She also has obstructive sleep apnea on CPAP. She is on spiriva every day, uses SABA every  evening. She uses nasal steroid, loratadine, nasal saline washes prn. She has not been wearing CPAP very reliably.   Review of Systems  Constitutional: Negative for fever and unexpected weight change.  HENT: Positive for congestion. Negative for dental problem, ear pain, nosebleeds, postnasal drip, rhinorrhea, sinus pressure, sneezing, sore throat and trouble swallowing.   Eyes: Negative for redness and itching.  Respiratory: Positive for shortness of breath. Negative for cough, chest tightness and wheezing.   Cardiovascular: Negative for chest pain, palpitations and leg swelling.  Gastrointestinal: Negative for nausea and vomiting.  Genitourinary: Negative for dysuria.  Musculoskeletal: Negative for joint swelling.  Skin: Negative for rash.  Neurological: Negative for headaches.  Hematological: Does not bruise/bleed easily.  Psychiatric/Behavioral: Negative for dysphoric mood. The patient is not nervous/anxious.        Objective:   Physical Exam Filed Vitals:   08/17/14 1353  BP: 132/76  Pulse: 84  Height: 4\' 10"  (1.473 m)  Weight: 238 lb (107.956 kg)  SpO2: 93%   Gen: Pleasant, obese, in no distress,  normal affect  ENT: No lesions,  mouth clear,  oropharynx clear, no postnasal drip  Neck: No JVD, no TMG, no carotid bruits  Lungs: No use of accessory muscles, no dullness to percussion, clear without rales or rhonchi  Cardiovascular: RRR, heart sounds normal, no murmur or gallops, no peripheral edema  Musculoskeletal: No deformities, no cyanosis or clubbing  Neuro: alert, non focal  Skin: Warm, no lesions or rashes    11/14/13 --  COMPARISON: 09/27/2012  FINDINGS:  Cardiac silhouette is normal in size. Normal mediastinal contour. No  hilar masses or convincing adenopathy.  Left basilar opacity is noted consistent with mild atelectasis.  Lungs are otherwise clear. No pleural effusion. No pneumothorax.  Bony thorax is intact.  IMPRESSION:  No active  cardiopulmonary disease      Assessment & Plan:  OSA (obstructive sleep apnea) Wears CPAP every few nights, not every night    COPD (chronic obstructive pulmonary disease) spiriva + SABA prn   Mediastinal lymphadenopathy We will repeat a CT scan of the chest, high res cuts, no contrast

## 2014-08-17 NOTE — Assessment & Plan Note (Signed)
Wears CPAP every few nights, not every night

## 2014-08-17 NOTE — Patient Instructions (Signed)
Please continue your Spiriva and albuterol as you have been using them Wearing your oxygen at all times Try to wear your CPAP every night We will perform a CT scan of her chest to evaluate your lymph nodes Follow with Dr. Delton CoombesByrum in 6 weeks to review your scan

## 2014-08-17 NOTE — Assessment & Plan Note (Signed)
We will repeat a CT scan of the chest, high res cuts, no contrast

## 2014-08-23 ENCOUNTER — Ambulatory Visit (INDEPENDENT_AMBULATORY_CARE_PROVIDER_SITE_OTHER)
Admission: RE | Admit: 2014-08-23 | Discharge: 2014-08-23 | Disposition: A | Discharge status: Home / Self Care | Payer: Medicare Other | Source: Ambulatory Visit | Attending: Emergency Medicine | Admitting: Emergency Medicine

## 2014-08-23 DIAGNOSIS — R0602 Shortness of breath: Secondary | ICD-10-CM

## 2014-09-26 ENCOUNTER — Other Ambulatory Visit: Payer: Self-pay | Admitting: Emergency Medicine

## 2014-10-05 ENCOUNTER — Ambulatory Visit (INDEPENDENT_AMBULATORY_CARE_PROVIDER_SITE_OTHER): Payer: Medicare Other | Admitting: Emergency Medicine

## 2014-10-05 ENCOUNTER — Encounter: Payer: Self-pay | Admitting: Emergency Medicine

## 2014-10-05 VITALS — BP 110/62 | HR 80 | Ht <= 58 in | Wt 236.0 lb

## 2014-10-05 DIAGNOSIS — J449 Chronic obstructive pulmonary disease, unspecified: Secondary | ICD-10-CM | POA: Diagnosis not present

## 2014-10-05 DIAGNOSIS — R590 Localized enlarged lymph nodes: Secondary | ICD-10-CM

## 2014-10-05 DIAGNOSIS — G4733 Obstructive sleep apnea (adult) (pediatric): Secondary | ICD-10-CM | POA: Diagnosis not present

## 2014-10-05 DIAGNOSIS — J309 Allergic rhinitis, unspecified: Secondary | ICD-10-CM

## 2014-10-05 DIAGNOSIS — R599 Enlarged lymph nodes, unspecified: Secondary | ICD-10-CM

## 2014-10-05 NOTE — Progress Notes (Signed)
Subjective:    Patient ID: Mackenzie Johnson, female    DOB: May 25, 1961, 54 y.o.   MRN: [id number]  HPI 54 year old woman, remote smoker (small exposure), with reported history of sarcoidosis made by CT scan and PET with non-hypermetabolic mediastinal LAD and some scattered nodular disease. No biopsy performed.  Hx of asthma based on PFT's done by Dr Blenda Nicely in [place].  She was on pred after initial dx made, was for ~ 4 yrs. Now tapered to off.   Also with a history of hypertension, diabetes, GERD, obstructive sleep apnea on CPAP, allergies > allergic to grasses and pollen, shots were considered but never done. Uses flonase qd. She has had trouble wearing her CPAP for last 2 weeks since dental work. Planning to restart. She is on O2 24x7, currently on 4L/min at all times.   ROV 03/23/13 -- f/u for hx asthma and possible sarcoidosis although no bx data, OSA on CPAP. She restarted the CPAP as planned but then it broke. [employer] is the company. States that her breathing has been doing a bit better since last visit.   ROV 11/14/13 -- follows for asthma, ? Sarcoidosis based on CT scan, OSA on CPAP.  Follows today and states that she has had sinus drainage of clear mucous, worse over the last 2 days. She is taking flonase qd.  No flares with abx or pred. She is tolerating CPAP. She is due for a CXR.   ROV 03/14/14 -- history of asthma and suspected sarcoidosis based on CT scan appearance. Also with sleep apnea on CPAP, marginal compliance lately. She also has allergic rhinitis and is on fluticasone and loratadine. We attempted to start nasal saline washes last time. She has been using these prn. Her cough is improved, continues to have some exertional SOB.  CXR last time stable. Uses spiriva, rarely uses SABA.    ROV 08/17/14 -- Follow-up visit for asthma and suspected sarcoidosis based on her CT scan of the chest. She also has obstructive sleep apnea on CPAP. She is on spiriva every day, uses SABA every  evening. She uses nasal steroid, loratadine, nasal saline washes prn. She has not been wearing CPAP very reliably.   ROV 10/05/14 -- follow-up visit for possible sarcoidosis with associated obstructive lung disease. She also has obstructive sleep apnea and uses CPAP. She has allergic rhinitis. Repeated her high-resolution CT scan in February, I reviewed > showed some very mild basilar interstitial changes. She is having some dyspnea w walking up hills, able to walk on flat ground well. Minimal wheeze, minimal cough although a bit more for the last couple weeks. She is on loratadine and flonase.  She is on spiriva, has been using albuterol nebs qhs. Needs an albuterol HFA.   Review of Systems  Constitutional: Negative for fever and unexpected weight change.  HENT: Positive for congestion. Negative for dental problem, ear pain, nosebleeds, postnasal drip, rhinorrhea, sinus pressure, sneezing, sore throat and trouble swallowing.   Eyes: Negative for redness and itching.  Respiratory: Positive for shortness of breath. Negative for cough, chest tightness and wheezing.   Cardiovascular: Negative for chest pain, palpitations and leg swelling.  Gastrointestinal: Negative for nausea and vomiting.  Genitourinary: Negative for dysuria.  Musculoskeletal: Negative for joint swelling.  Skin: Negative for rash.  Neurological: Negative for headaches.  Hematological: Does not bruise/bleed easily.  Psychiatric/Behavioral: Negative for dysphoric mood. The patient is not nervous/anxious.        Objective:   Physical Exam Filed Vitals:  10/05/14 1346  BP: 110/62  Pulse: 80  Height: 4\' 10"  (1.473 m)  Weight: 236 lb (107.049 kg)  SpO2: 99%   Gen: Pleasant, obese, in no distress,  normal affect  ENT: No lesions,  mouth clear,  oropharynx clear, no postnasal drip  Neck: No JVD, no TMG, no carotid bruits  Lungs: No use of accessory muscles, no dullness to percussion, clear without rales or  rhonchi  Cardiovascular: RRR, heart sounds normal, no murmur or gallops, no peripheral edema  Musculoskeletal: No deformities, no cyanosis or clubbing  Neuro: alert, non focal  Skin: Warm, no lesions or rashes   08/23/14 --   COMPARISON: Chest CT 11/13/2011.  FINDINGS: Mediastinum/Lymph Nodes: Heart size is borderline enlarged. There is no significant pericardial fluid, thickening or pericardial calcification. Marked dilatation of the pulmonic trunk (4.0 cm in diameter), suggesting pulmonary arterial hypertension. No pathologically enlarged mediastinal or hilar lymph nodes. Please note that accurate exclusion of hilar adenopathy is limited on noncontrast CT scans. Esophagus is unremarkable. Incidental note is made of an aberrant right subclavian artery (normal anatomical variant). No associated diverticulum of [place]. No axillary adenopathy.  Lungs/Pleura: High-resolution images demonstrate some peribronchovascular ground-glass attenuation, reticulation and architectural distortion in the lower lobes of the lungs bilaterally (left much greater than right). In these same regions there is some mild cylindrical bronchiectasis. A few other patchy areas of very mild ground-glass attenuation and subtle subpleural reticulation is noted, however, this is not a predominant finding, and appears randomly distributed. No honeycombing. Inspiratory and expiratory imaging is unremarkable.  Upper Abdomen: Unremarkable.  Musculoskeletal/Soft Tissues: There are no aggressive appearing lytic or blastic lesions noted in the visualized portions of the skeleton.  IMPRESSION: 1. There are no definite fibrotic changes in the lower lobes of the lungs bilaterally (left lower lobe to a much greater extent than the right). These findings are rather isolated, however, and are strongly favored to be areas of post infectious/inflammatory fibrosis. No other characteristic findings to suggest  interstitial lung disease are noted at this time. 2. Marked dilatation of the pulmonic trunk, suggestive of pulmonary arterial hypertension. 3. Additional incidental findings, as above.      Assessment & Plan:  COPD (chronic obstructive pulmonary disease) We will continue Spiriva once a day He will use albuterol nebulized daily at bedtime and we will obtain an albuterol HFA for her to use when necessary We will try to maximize her nasal allergy treatment   Allergic rhinitis Continue nasal steroid and loratadine   Mediastinal lymphadenopathy Presumed sarcoidosis. No evidence of interstitial disease on my review of her CT scan of the chest done in February   OSA (obstructive sleep apnea) Continue CPAP as she has been using

## 2014-10-05 NOTE — Patient Instructions (Signed)
Please continue your inhaled medications as you have been using them. We will obtain an albuterol handheld inhaler for you to use 2 puffs as needed for shortness of breath Continue your CPAP every night Your CT scan of the chest did not show any significant scarring. We will decide in the future if this needs to be repeated Continue your nasal spray and loratadine as you have been taking them Follow with Dr Delton CoombesByrum in 6 months or sooner if you have any problems

## 2014-10-05 NOTE — Assessment & Plan Note (Signed)
Continue CPAP as she has been using

## 2014-10-05 NOTE — Assessment & Plan Note (Signed)
Presumed sarcoidosis. No evidence of interstitial disease on my review of her CT scan of the chest done in February

## 2014-10-05 NOTE — Assessment & Plan Note (Signed)
We will continue Spiriva once a day He will use albuterol nebulized daily at bedtime and we will obtain an albuterol HFA for her to use when necessary We will try to maximize her nasal allergy treatment

## 2014-10-05 NOTE — Assessment & Plan Note (Signed)
Continue nasal steroid and loratadine 

## 2014-10-24 ENCOUNTER — Other Ambulatory Visit: Payer: Self-pay | Admitting: Emergency Medicine

## 2014-12-19 ENCOUNTER — Other Ambulatory Visit: Payer: Self-pay | Admitting: Emergency Medicine

## 2015-01-16 ENCOUNTER — Encounter: Payer: Self-pay | Admitting: Internal Medicine

## 2015-02-27 DIAGNOSIS — J42 Unspecified chronic bronchitis: Secondary | ICD-10-CM | POA: Insufficient documentation

## 2015-02-27 DIAGNOSIS — E782 Mixed hyperlipidemia: Secondary | ICD-10-CM | POA: Insufficient documentation

## 2015-02-27 DIAGNOSIS — N289 Disorder of kidney and ureter, unspecified: Secondary | ICD-10-CM

## 2015-02-27 HISTORY — DX: Disorder of kidney and ureter, unspecified: N28.9

## 2015-02-27 HISTORY — DX: Morbid (severe) obesity due to excess calories: E66.01

## 2015-02-27 HISTORY — DX: Unspecified chronic bronchitis: J42

## 2015-02-27 HISTORY — DX: Mixed hyperlipidemia: E78.2

## 2015-03-28 ENCOUNTER — Encounter: Payer: Self-pay | Admitting: Emergency Medicine

## 2015-03-28 ENCOUNTER — Ambulatory Visit (INDEPENDENT_AMBULATORY_CARE_PROVIDER_SITE_OTHER): Payer: Medicare Other | Admitting: Emergency Medicine

## 2015-03-28 VITALS — BP 112/66 | HR 75 | Ht 59.0 in | Wt 238.0 lb

## 2015-03-28 DIAGNOSIS — J309 Allergic rhinitis, unspecified: Secondary | ICD-10-CM

## 2015-03-28 DIAGNOSIS — D869 Sarcoidosis, unspecified: Secondary | ICD-10-CM

## 2015-03-28 DIAGNOSIS — G4733 Obstructive sleep apnea (adult) (pediatric): Secondary | ICD-10-CM

## 2015-03-28 DIAGNOSIS — J449 Chronic obstructive pulmonary disease, unspecified: Secondary | ICD-10-CM

## 2015-03-28 MED ORDER — FLUTICASONE PROPIONATE 50 MCG/ACT NA SUSP: 2.0000 | Freq: Two times a day (BID) | NASAL | Status: DC

## 2015-03-28 MED ORDER — ALBUTEROL SULFATE HFA 108 (90 BASE) MCG/ACT IN AERS: 2.0000 | INHALATION_SPRAY | Freq: Four times a day (QID) | RESPIRATORY_TRACT | Status: DC | PRN

## 2015-03-28 NOTE — Assessment & Plan Note (Signed)
Continue nocturnal CPAP °

## 2015-03-28 NOTE — Progress Notes (Signed)
Subjective:    Patient ID: Mackenzie Johnson, female    DOB: [birth year], 54 y.o.   MRN: [id number]  HPI 54 year old woman, remote smoker (small exposure), with reported history of sarcoidosis made by CT scan and PET with non-hypermetabolic mediastinal LAD and some scattered nodular disease. No biopsy performed.  Hx of asthma based on PFT's done by Dr Blenda Nicely in [place].  She was on pred after initial dx made, was for ~ 4 yrs. Now tapered to off.   Also with a history of hypertension, diabetes, GERD, obstructive sleep apnea on CPAP, allergies > allergic to grasses and pollen, shots were considered but never done. Uses flonase qd. She has had trouble wearing her CPAP for last 2 weeks since dental work. Planning to restart. She is on O2 24x7, currently on 4L/min at all times.   ROV 03/23/13 -- f/u for hx asthma and possible sarcoidosis although no bx data, OSA on CPAP. f/u for hx asthma and possible sarcoidosis although no bx data, OSA on CPAP. She restarted the CPAP as planned but then it broke. [name] is the company. States that her breathing has been doing a bit better since last visit.   ROV 11/14/13 -- follows for asthma, ? Sarcoidosis based on CT scan, OSA on CPAP. follows for asthma, ? Sarcoidosis based on CT scan, OSA on CPAP.  Follows today and states that she has had sinus drainage of clear mucous, worse over the last 2 days. She is taking flonase qd.  No flares with abx or pred. She is tolerating CPAP. She is due for a CXR.   ROV 03/14/14 -- history of asthma and suspected sarcoidosis based on CT scan appearance. Also with sleep apnea on CPAP, marginal compliance lately. She also has allergic rhinitis and is on fluticasone and loratadine. history of asthma and suspected sarcoidosis based on CT scan appearance. Also with sleep apnea on CPAP, marginal compliance lately. She also has allergic rhinitis and is on fluticasone and loratadine. We attempted to start nasal saline washes last time. She has been using these prn. Her cough is improved, continues to have some exertional SOB.  CXR last time stable. Uses spiriva, rarely uses SABA.    ROV 08/17/14 -- Follow-up visit for asthma and suspected sarcoidosis based on her CT scan of the chest. She also has obstructive sleep apnea on CPAP. She is on spiriva every day, uses SABA every  evening. She uses nasal steroid, loratadine, nasal saline washes prn. She has not been wearing CPAP very reliably. Follow-up visit for asthma and suspected sarcoidosis based on her CT scan of the chest. She also has obstructive sleep apnea on CPAP. She is on spiriva every day, uses SABA every  evening. She uses nasal steroid, loratadine, nasal saline washes prn. She has not been wearing CPAP very reliably.   ROV 10/05/14 -- follow-up visit for possible sarcoidosis with associated obstructive lung disease. She also has obstructive sleep apnea and uses CPAP. She has allergic rhinitis. Repeated her high-resolution CT scan in February, I reviewed > showed some very mild basilar interstitial changes. She is having some dyspnea w walking up hills, able to walk on flat ground well. Minimal wheeze, minimal cough although a bit more for the last couple weeks. She is on loratadine and flonase. follow-up visit for possible sarcoidosis with associated obstructive lung disease. She also has obstructive sleep apnea and uses CPAP. She has allergic rhinitis. Repeated her high-resolution CT scan in February, I reviewed > showed some very mild basilar interstitial changes. She is having some dyspnea w walking up hills, able to walk on flat ground well. Minimal wheeze, minimal cough although a bit more for the last couple weeks. She is on loratadine and flonase.  She is on spiriva, has been using albuterol nebs qhs. Needs an albuterol HFA.   ROV 03/28/15 -- follow-up visit for stroke to lung disease, sarcoidosis dx by mediastinoscopy (Dr Edwyna Shell), obstructive sleep apnea on CPAP. Last seen 6 months ago. follow-up visit for stroke to lung disease, sarcoidosis dx by mediastinoscopy (Dr Edwyna Shell), obstructive sleep apnea on CPAP. Last seen 6 months ago. She has chronic hypoxemic resp failure on 2L/min. She is on fluticasone NS and loratadine. She is on spiriva qd, uses albuterol prn about once a day. She reports that she has more problems on humid days. She has dry mouth and dyspnea with grocery shopping. No exacerbations.  She is compliant with and is benefiting from her CPAP.    Review of Systems  Constitutional: Negative for fever and unexpected weight change.  HENT: Positive for congestion. Negative for dental problem, ear pain, nosebleeds, postnasal drip, rhinorrhea, sinus pressure, sneezing, sore throat and trouble swallowing.   Eyes: Negative for  redness and itching.  Respiratory: Positive for shortness of breath. Negative for cough, chest tightness and wheezing.   Cardiovascular: Negative for chest pain, palpitations and leg swelling.  Gastrointestinal: Negative for nausea and vomiting.  Genitourinary: Negative for dysuria.  Musculoskeletal: Negative for joint swelling.  Skin: Negative for rash.  Neurological: Negative for headaches.  Hematological: Does not bruise/bleed easily.  Psychiatric/Behavioral:  Negative for dysphoric mood. The patient is not nervous/anxious.        Objective:   Physical Exam Filed Vitals:   03/28/15 1342 03/28/15 1343  BP:  112/66  Pulse:  75  Height:  (1.499 m)   Weight: 238 lb (107.956 kg)   SpO2:  98%   Gen: Pleasant, obese, in no distress,  normal affect  ENT: No lesions,  mouth clear,  oropharynx clear, no postnasal drip  Neck: No JVD, no TMG, no carotid bruits  Lungs: No use of accessory muscles, no dullness to percussion, clear without rales or rhonchi  Cardiovascular: RRR, heart sounds normal, no murmur or gallops, no peripheral edema  Musculoskeletal: No deformities, no cyanosis or clubbing  Neuro: alert, non focal  Skin: Warm, no lesions or rashes   08/23/14 --   COMPARISON: Chest CT 11/13/2011.  FINDINGS: Mediastinum/Lymph Nodes: Heart size is borderline enlarged. There is no significant pericardial fluid, thickening or pericardial calcification. Marked dilatation of the pulmonic trunk (4.0 cm in diameter), suggesting pulmonary arterial hypertension. No pathologically enlarged mediastinal or hilar lymph nodes. Please note that accurate exclusion of hilar adenopathy is limited on noncontrast CT scans. Esophagus is unremarkable. Incidental note is made of an aberrant right subclavian artery (normal anatomical variant). No associated diverticulum of Kommerell. No axillary adenopathy.  Lungs/Pleura: High-resolution images demonstrate some peribronchovascular ground-glass attenuation, reticulation and architectural distortion in the lower lobes of the lungs bilaterally (left much greater than right). In these same regions there is some mild cylindrical bronchiectasis. A few other patchy areas of very mild ground-glass attenuation and subtle subpleural reticulation is noted, however, this is not a predominant finding, and appears randomly distributed. No honeycombing. Inspiratory and expiratory imaging is  unremarkable.  Upper Abdomen: Unremarkable.  Musculoskeletal/Soft Tissues: There are no aggressive appearing lytic or blastic lesions noted in the visualized portions of the skeleton.  IMPRESSION: 1. There are no definite fibrotic changes in the lower lobes of the lungs bilaterally (left lower lobe to a much greater extent than the right). These findings are rather isolated, however, and are strongly favored to be areas of post infectious/inflammatory fibrosis. No other characteristic findings to suggest interstitial lung disease are noted at this time. 2. Marked dilatation of the pulmonic trunk, suggestive of pulmonary arterial hypertension. 3. Additional incidental findings, as above.      Assessment & Plan:  COPD (chronic obstructive pulmonary disease) Overall stable without any evidence of an acute exacerbation or any history of exacerbation since her last visit.  Continue Spiriva and albuterol nebulized when necessary. We will give her a new prescription for an albuterol HFA  Sarcoidosis Diagnosis based on mediastinoscopy performed by Dr Edwyna Shell. No indication for repeat CT scan or chest x-ray at this time. We will discuss the timing of the repeat at her next visit with me  OSA (obstructive sleep apnea) Continue nocturnal CPAP  Allergic rhinitis We will increase her fluticasone nasal spray to twice a day and rewrite her prescription so that she'll have enough. She will continue loratadine once a day

## 2015-03-28 NOTE — Patient Instructions (Addendum)
Please continue to use your oxygen at all times Wear your CPAP every night Continue your Spiriva daily Use albuterol nebulizer up to every 4 hours if needed for shortness of breath We will write a new prescription for an albuterol inhaler to use 2 puffs if needed Flu shot up to date.  Continue your fluticasone nasal spray, increase to 2 sprays each side twice a day  Continue loratadine daily Follow with Dr Delton Coombes in 6 months or sooner if you have any problems

## 2015-03-28 NOTE — Assessment & Plan Note (Signed)
Diagnosis based on mediastinoscopy performed by Dr Edwyna Shell. No indication for repeat CT scan or chest x-ray at this time. We will discuss the timing of the repeat at her next visit with me

## 2015-03-28 NOTE — Addendum Note (Signed)
Addended by: Nicanor Alcon on: 03/28/2015 02:12 PM   Modules accepted: Orders

## 2015-03-28 NOTE — Assessment & Plan Note (Addendum)
We will increase her fluticasone nasal spray to twice a day and rewrite her prescription so that she'll have enough. She will continue loratadine once a day

## 2015-03-28 NOTE — Assessment & Plan Note (Signed)
Overall stable without any evidence of an acute exacerbation or any history of exacerbation since her last visit.  Continue Spiriva and albuterol nebulized when necessary. We will give her a new prescription for an albuterol HFA

## 2015-04-04 ENCOUNTER — Encounter: Payer: Self-pay | Admitting: Sports Medicine

## 2015-04-04 ENCOUNTER — Ambulatory Visit (INDEPENDENT_AMBULATORY_CARE_PROVIDER_SITE_OTHER): Payer: Medicare Other | Admitting: Sports Medicine

## 2015-04-04 ENCOUNTER — Ambulatory Visit (INDEPENDENT_AMBULATORY_CARE_PROVIDER_SITE_OTHER): Payer: Medicare Other

## 2015-04-04 VITALS — BP 138/76 | HR 86 | Resp 18

## 2015-04-04 DIAGNOSIS — M2141 Flat foot [pes planus] (acquired), right foot: Secondary | ICD-10-CM | POA: Diagnosis not present

## 2015-04-04 DIAGNOSIS — E119 Type 2 diabetes mellitus without complications: Secondary | ICD-10-CM

## 2015-04-04 DIAGNOSIS — M216X2 Other acquired deformities of left foot: Secondary | ICD-10-CM | POA: Diagnosis not present

## 2015-04-04 DIAGNOSIS — M6789 Other specified disorders of synovium and tendon, multiple sites: Secondary | ICD-10-CM | POA: Diagnosis not present

## 2015-04-04 DIAGNOSIS — M2142 Flat foot [pes planus] (acquired), left foot: Secondary | ICD-10-CM | POA: Diagnosis not present

## 2015-04-04 DIAGNOSIS — M79673 Pain in unspecified foot: Secondary | ICD-10-CM | POA: Diagnosis not present

## 2015-04-04 DIAGNOSIS — M216X1 Other acquired deformities of right foot: Secondary | ICD-10-CM | POA: Diagnosis not present

## 2015-04-04 DIAGNOSIS — M76829 Posterior tibial tendinitis, unspecified leg: Secondary | ICD-10-CM

## 2015-04-04 NOTE — Progress Notes (Signed)
Patient ID: Mackenzie Johnson, female   DOB: 12-07-1960, 54 y.o.   MRN: [id number] Subjective: Mackenzie Johnson is a 54 y.o. diabetic female patient who presents to office for evaluation of bilateral foot pain. Patient complains of progressive pain especially over the last 3 months in the both feet that starts as fatigue in the medial arch and then ankle and sometimes up the legs; pain can even be sharp and achy at times. Pain is now interferring with daily activities.  Patient has also tried OTC inserts for feet which made feet feel better in the past. Admits to occassional tingling in the feet unchanged from the past, chronic knee and low back pain. Patient denies any other pedal complaints or related constitutional symptoms at this time.   FBS- not recorded  Hemoglobin A1C- patient does not recall  Patient Active Problem List   Diagnosis Date Noted  . Allergic rhinitis 11/14/2013  . Sarcoidosis (HCC) 02/22/2013  . OSA (obstructive sleep apnea) 02/22/2013  . Obesity hypoventilation syndrome (HCC) 02/22/2013  . CKD (chronic kidney disease), stage III 12/02/2012  . Leg pain 12/02/2012  . IDDM (insulin dependent diabetes mellitus) (HCC) 12/02/2012  . COPD (chronic obstructive pulmonary disease) (HCC) 12/02/2012  . Anemia 12/02/2012  . GERD (gastroesophageal reflux disease) 12/02/2012  . RLS (restless legs syndrome) 12/02/2012   Current Outpatient Prescriptions on File Prior to Visit  Medication Sig Dispense Refill  . albuterol (PROVENTIL HFA;VENTOLIN HFA) 108 (90 BASE) MCG/ACT inhaler Inhale 2 puffs into the lungs every 6 (six) hours as needed for wheezing or shortness of breath. 1 Inhaler 6  . albuterol (PROVENTIL) (2.5 MG/3ML) 0.083% nebulizer solution Take 2.5 mg by nebulization 2 (two) times daily as needed. Usually uses at bedtime, occasionally has a treatment during the day    . esomeprazole (NEXIUM) 40 MG capsule Take 40 mg by mouth daily after lunch daily after lunch.    . fluticasone  (FLONASE) 50 MCG/ACT nasal spray INSTILL 2 SPRAYS INTO BOTH NOSTRILS TWICE DAILY 16 g 11  . fluticasone (FLONASE) 50 MCG/ACT nasal spray Place 2 sprays into both nostrils 2 (two) times daily. 32 g 11  . furosemide (LASIX) 40 MG tablet Take 40 mg by mouth daily.    Marland Kitchen gemfibrozil (LOPID) 600 MG tablet Take 600 mg by mouth 2 (two) times daily before a meal.    . insulin glargine (LANTUS) 100 UNIT/ML injection Inject 50 Units into the skin at bedtime.    Marland Kitchen loratadine (CLARITIN) 10 MG tablet TAKE 1 TABLET BY MOUTH ONCE DAILY 30 tablet 5  . nitroGLYCERIN (NITROSTAT) 0.4 MG SL tablet Place 0.4 mg under the tongue every 5 (five) minutes as needed for chest pain.    . ranolazine (RANEXA) 500 MG 12 hr tablet Take 500 mg by mouth 2 (two) times daily.    . rosuvastatin (CRESTOR) 20 MG tablet Take 40 mg by mouth daily.     . sertraline (ZOLOFT) 100 MG tablet Take 100 mg by mouth daily.    . temazepam (RESTORIL) 15 MG capsule Take 15 mg by mouth at bedtime as needed for sleep.    Marland Kitchen tiotropium (SPIRIVA) 18 MCG inhalation capsule Place 18 mcg into inhaler and inhale daily.    . traZODone (DESYREL) 50 MG tablet Take 50 mg by mouth at bedtime.     No current facility-administered medications on file prior to visit.   Allergies  Allergen Reactions  . Sulfa Antibiotics Hives     Objective:  General: Alert and  oriented x3 in no acute distress with Oxygen tank   Dermatology: Asymptomatic mild callus medial aspect of hallux bilateral, No open lesions bilateral lower extremities, no webspace macerations, no ecchymosis bilateral, all nails x 10 are well manicured.  Vascular: Dorsalis Pedis and Posterior Tibial pedal pulses 1/4, Capillary Fill Time 3 seconds, + pedal hair growth bilateral, no edema bilateral lower extremities, Temperature gradient within normal limits.  Neurology: Michaell Cowing sensation intact via light touch bilateral, Protective sensation intact  with Phoebe Perch Monofilament to all pedal sites,  vibratory intact bilateral,  No babinski sign present bilateral.   Musculoskeletal: Mild tenderness with palpation along medial arch, medial fascial band bilateral, mild tenderness along Posterior tibial tendon course with medial soft tissue buldge noted, there is decreased ankle rom with knee extending  vs flexed resembling gastroc equnius bilateral, Subtalar joint range of motion is within normal limits, there is limited 1st MTPJ range of motion bilateral. There is no 1st ray hypermobility noted bilateral. On weightbearing exam, there is medial arch collapse bilateral on weightbearing exam, mod to severe RF valgus bilateral, medial talar buldge,"too-many toes" sign appreciated, unable to perform heel rise test due to pain in arches.   Gait: Antalgic gait with increased medial arch collapse and pronatory influence noted bilateral, medial 1st MPJ roll off in toe-off, bilateral .   X-rays:  Right & left Foot 3 views    Impression: Significant pes planus, midfoot and stj arthritis, lateral ankle impingement       Assessment and Plan: Problem List Items Addressed This Visit    None    Visit Diagnoses    Foot pain, unspecified laterality    -  Primary    Relevant Orders    DG Foot Complete Right    DG Foot Complete Left    Pes planus of both feet        Posterior tibial tendon dysfunction        Stage III    Acquired equinus deformity of both feet        Diabetes mellitus without complication (HCC)          -Complete examination performed -Xrays reviewed -Discussed treatement options; discussed pes planus deformity;conservative and surgical including but not limited to the use of bone grafts, bone cuts, tendon lengthening/reconstruction to achieve improvement in flatfoot deformity; patient is not a surgical candidate at this time due to other medical conditions, COPD requiring Oxygen therapy, and CKD.  -Advised patient best options are symptom management with rise, ice, elevation,  antiinflammatories, and mechanical control to help reduce pain -Gave patient Rx for custom foot orthosis with medial flange or functional AFO/Richie brace to be done at [facility].  -Patient to return to office after orthosis are made or sooner if condition worsens.  Asencion Islam, DPM

## 2015-06-14 ENCOUNTER — Other Ambulatory Visit: Payer: Self-pay | Admitting: Emergency Medicine

## 2015-09-03 ENCOUNTER — Ambulatory Visit (INDEPENDENT_AMBULATORY_CARE_PROVIDER_SITE_OTHER): Payer: Medicare Other | Admitting: Emergency Medicine

## 2015-09-03 ENCOUNTER — Encounter: Payer: Self-pay | Admitting: Emergency Medicine

## 2015-09-03 VITALS — BP 152/92 | HR 81 | Ht 59.0 in | Wt 244.0 lb

## 2015-09-03 DIAGNOSIS — G4733 Obstructive sleep apnea (adult) (pediatric): Secondary | ICD-10-CM | POA: Diagnosis not present

## 2015-09-03 DIAGNOSIS — J309 Allergic rhinitis, unspecified: Secondary | ICD-10-CM | POA: Diagnosis not present

## 2015-09-03 DIAGNOSIS — D869 Sarcoidosis, unspecified: Secondary | ICD-10-CM | POA: Diagnosis not present

## 2015-09-03 DIAGNOSIS — J449 Chronic obstructive pulmonary disease, unspecified: Secondary | ICD-10-CM | POA: Diagnosis not present

## 2015-09-03 NOTE — Assessment & Plan Note (Signed)
We'll continue CPAP as ordered. She needs to have oxygen bled in and she will contact [employer] to ensure that she has the proper connections and equipment to do this

## 2015-09-03 NOTE — Assessment & Plan Note (Signed)
Fluticasone nasal spray. Discussed increasing this to twice a day chronically. She may do this depending on how well controlled she is on her current regimen

## 2015-09-03 NOTE — Patient Instructions (Addendum)
Please continue Spiriva every day Use albuterol as needed for shortness of breath Continue oxygen at all times. Please use this with all exertion Continue fluticasone nasal spray, 2 sprays each nostril one to 2 times a day We will not repeat her CT scan of the chest at this time. We will likely need to do so next February (after 2 years) Follow with Dr Delton CoombesByrum in 6 months or sooner if you have any problems

## 2015-09-03 NOTE — Progress Notes (Signed)
Subjective:    Patient ID: Mackenzie Johnson, female    DOB: [birth year], 55 y.o.   MRN: [id number]  HPI 55 year old woman, remote smoker (small exposure), with reported history of sarcoidosis made by CT scan and PET with non-hypermetabolic mediastinal LAD and some scattered nodular disease. No biopsy performed.  Hx of asthma based on PFT's done by Dr Blenda Nicely in [place].  She was on pred after initial dx made, was for ~ 4 yrs. Now tapered to off.   Also with a history of hypertension, diabetes, GERD, obstructive sleep apnea on CPAP, allergies > allergic to grasses and pollen, shots were considered but never done. Uses flonase qd. She has had trouble wearing her CPAP for last 2 weeks since dental work. Planning to restart. She is on O2 24x7, currently on 4L/min at all times.   ROV 03/23/13 -- f/u for hx asthma and possible sarcoidosis although no bx data, OSA on CPAP. She restarted the CPAP as planned but then it broke. [name] is the company. States that her breathing has been doing a bit better since last visit.   ROV 11/14/13 -- follows for asthma, ? Sarcoidosis based on CT scan, OSA on CPAP.  Follows today and states that she has had sinus drainage of clear mucous, worse over the last 2 days. She is taking flonase qd.  No flares with abx or pred. She is tolerating CPAP. She is due for a CXR.   ROV 03/14/14 -- history of asthma and suspected sarcoidosis based on CT scan appearance. Also with sleep apnea on CPAP, marginal compliance lately. She also has allergic rhinitis and is on fluticasone and loratadine. We attempted to start nasal saline washes last time. She has been using these prn. Her cough is improved, continues to have some exertional SOB.  CXR last time stable. Uses spiriva, rarely uses SABA.    ROV 08/17/14 -- Follow-up visit for asthma and suspected sarcoidosis based on her CT scan of the chest. She also has obstructive sleep apnea on CPAP. She is on spiriva every day, uses SABA every  evening. She uses nasal steroid, loratadine, nasal saline washes prn. She has not been wearing CPAP very reliably.   ROV 10/05/14 -- follow-up visit for possible sarcoidosis with associated obstructive lung disease. She also has obstructive sleep apnea and uses CPAP. She has allergic rhinitis. Repeated her high-resolution CT scan in February, I reviewed > showed some very mild basilar interstitial changes. She is having some dyspnea w walking up hills, able to walk on flat ground well. Minimal wheeze, minimal cough although a bit more for the last couple weeks. She is on loratadine and flonase.  She is on spiriva, has been using albuterol nebs qhs. Needs an albuterol HFA.   ROV 03/28/15 -- follow-up visit for obstructive lung disease, sarcoidosis dx by mediastinoscopy (Dr Edwyna Shell), obstructive sleep apnea on CPAP. Last seen 6 months ago. She has chronic hypoxemic resp failure on 2L/min. She is on fluticasone NS and loratadine. She is on spiriva qd, uses albuterol prn about once a day. She reports that she has more problems on humid days. She has dry mouth and dyspnea with grocery shopping. No exacerbations.  She is compliant with and is benefiting from her CPAP.   ROV 09/03/15 -- patient has a history of sarcoidosis diagnosed by mediastinoscopy. She has associated obstructive lung disease. She also is on CPAP for obstructive sleep apnea. She has chronic hypoxemic respiratory failure and uses 4 L/m. She also has allergic rhinitis  for which she is on flonase, uses 1-2x a day depending on how she feels. She is currently using Spiriva once a day and albuterol rarely. She has some exertional SOB with long walks and hills. She doesn';t always wear he O2 with this walk.    Review of Systems  Constitutional: Negative for fever and unexpected weight change.  HENT: Positive for congestion. Negative for dental problem, ear pain, nosebleeds, postnasal drip, rhinorrhea, sinus pressure, sneezing, sore throat and trouble  swallowing.   Eyes: Negative for redness and itching.  Respiratory: Positive for shortness of breath. Negative for cough, chest tightness and wheezing.   Cardiovascular: Negative for chest pain, palpitations and leg swelling.  Gastrointestinal: Negative for nausea and vomiting.  Genitourinary: Negative for dysuria.  Musculoskeletal: Negative for joint swelling.  Skin: Negative for rash.  Neurological: Negative for headaches.  Hematological: Does not bruise/bleed easily.  Psychiatric/Behavioral: Negative for dysphoric mood. The patient is not nervous/anxious.        Objective:   Physical Exam Filed Vitals:   09/03/15 1050 09/03/15 1051  BP:  152/92  Pulse:  81  Height:  (1.499 m)   Weight: 244 lb (110.678 kg)   SpO2:  98%   Gen: Pleasant, obese, in no distress,  normal affect  ENT: No lesions,  mouth clear,  oropharynx clear, no postnasal drip  Neck: No JVD, no TMG, no carotid bruits  Lungs: No use of accessory muscles, no dullness to percussion, clear without rales or rhonchi  Cardiovascular: RRR, heart sounds normal, no murmur or gallops, no peripheral edema  Musculoskeletal: No deformities, no cyanosis or clubbing  Neuro: alert, non focal  Skin: Warm, no lesions or rashes   08/23/14 --   COMPARISON: Chest CT 11/13/2011.  FINDINGS: Mediastinum/Lymph Nodes: Heart size is borderline enlarged. There is no significant pericardial fluid, thickening or pericardial calcification. Marked dilatation of the pulmonic trunk (4.0 cm in diameter), suggesting pulmonary arterial hypertension. No pathologically enlarged mediastinal or hilar lymph nodes. Please note that accurate exclusion of hilar adenopathy is limited on noncontrast CT scans. Esophagus is unremarkable. Incidental note is made of an aberrant right subclavian artery (normal anatomical variant). No associated diverticulum of Kommerell. No axillary adenopathy.  Lungs/Pleura: High-resolution images  demonstrate some peribronchovascular ground-glass attenuation, reticulation and architectural distortion in the lower lobes of the lungs bilaterally (left much greater than right). In these same regions there is some mild cylindrical bronchiectasis. A few other patchy areas of very mild ground-glass attenuation and subtle subpleural reticulation is noted, however, this is not a predominant finding, and appears randomly distributed. No honeycombing. Inspiratory and expiratory imaging is unremarkable.  Upper Abdomen: Unremarkable.  Musculoskeletal/Soft Tissues: There are no aggressive appearing lytic or blastic lesions noted in the visualized portions of the skeleton.  IMPRESSION: 1. There are no definite fibrotic changes in the lower lobes of the lungs bilaterally (left lower lobe to a much greater extent than the right). These findings are rather isolated, however, and are strongly favored to be areas of post infectious/inflammatory fibrosis. No other characteristic findings to suggest interstitial lung disease are noted at this time. 2. Marked dilatation of the pulmonic trunk, suggestive of pulmonary arterial hypertension. 3. Additional incidental findings, as above.      Assessment & Plan:  OSA (obstructive sleep apnea) We'll continue CPAP as ordered. She needs to have oxygen bled in and she will contact [employer] to ensure that she has the proper connections and equipment to do this  COPD (chronic obstructive pulmonary  disease) Continue Spiriva as ordered. Albuterol when necessary - uses rarely.  Allergic rhinitis Fluticasone nasal spray. Discussed increasing this to twice a day chronically. She may do this depending on how well controlled she is on her current regimen  Sarcoidosis Appears to be clinically stable. I do not believe she needs a repeat CT scan of the chest at this time. We will likely repeat in February 2018 at the two-year mark.

## 2015-09-03 NOTE — Assessment & Plan Note (Signed)
Appears to be clinically stable. I do not believe she needs a repeat CT scan of the chest at this time. We will likely repeat in February 2018 at the two-year mark.

## 2015-09-03 NOTE — Assessment & Plan Note (Signed)
Continue Spiriva as ordered. Albuterol when necessary - uses rarely.

## 2015-09-05 ENCOUNTER — Telehealth: Payer: Self-pay | Admitting: Emergency Medicine

## 2015-09-05 NOTE — Telephone Encounter (Signed)
Called and spoke to Amy at [facility]. Amy asking if the pt should be on both Anoro and Spiriva. Advised Amy that per RB's note the pt should only be on Spiriva. Amy states the pt's PCP sent the order for Anoro. Amy states she would reach out the pt's PCP to inform them pt is already on an anti-cholinergic. Amy states Anoro will not be dispensed.   Will send to RB as FYI.

## 2015-10-04 ENCOUNTER — Other Ambulatory Visit: Payer: Self-pay | Admitting: Emergency Medicine

## 2015-11-01 ENCOUNTER — Other Ambulatory Visit: Payer: Self-pay | Admitting: Emergency Medicine

## 2016-03-03 ENCOUNTER — Encounter: Payer: Self-pay | Admitting: Emergency Medicine

## 2016-03-03 ENCOUNTER — Ambulatory Visit (INDEPENDENT_AMBULATORY_CARE_PROVIDER_SITE_OTHER): Payer: Medicare Other | Admitting: Emergency Medicine

## 2016-03-03 DIAGNOSIS — E662 Morbid (severe) obesity with alveolar hypoventilation: Secondary | ICD-10-CM | POA: Diagnosis not present

## 2016-03-03 DIAGNOSIS — J449 Chronic obstructive pulmonary disease, unspecified: Secondary | ICD-10-CM | POA: Diagnosis not present

## 2016-03-03 DIAGNOSIS — Z23 Encounter for immunization: Secondary | ICD-10-CM | POA: Diagnosis not present

## 2016-03-03 DIAGNOSIS — J309 Allergic rhinitis, unspecified: Secondary | ICD-10-CM | POA: Diagnosis not present

## 2016-03-03 DIAGNOSIS — G4733 Obstructive sleep apnea (adult) (pediatric): Secondary | ICD-10-CM | POA: Diagnosis not present

## 2016-03-03 NOTE — Assessment & Plan Note (Signed)
To use Spiriva. She did not tolerate Anoro. Continue albuterol as needed. Flu shot today.

## 2016-03-03 NOTE — Progress Notes (Signed)
Subjective:    Patient ID: Mackenzie Johnson, female    DOB: 1960-10-23, 55 y.o.   MRN: [id number]  HPI  ROV 03/28/15 -- follow-up visit for obstructive lung disease, sarcoidosis dx by mediastinoscopy (Dr Edwyna Shell), obstructive sleep apnea on CPAP. Last seen 6 months ago. She has chronic hypoxemic resp failure on 2L/min. She is on fluticasone NS and loratadine. She is on spiriva qd, uses albuterol prn about once a day. She reports that she has more problems on humid days. She has dry mouth and dyspnea with grocery shopping. No exacerbations.  She is compliant with and is benefiting from her CPAP.   ROV 09/03/15 -- patient has a history of sarcoidosis diagnosed by mediastinoscopy. She has associated obstructive lung disease. She also is on CPAP for obstructive sleep apnea. She has chronic hypoxemic respiratory failure and uses 4 L/m. She also has allergic rhinitis for which she is on flonase, uses 1-2x a day depending on how she feels. She is currently using Spiriva once a day and albuterol rarely. She has some exertional SOB with long walks and hills. She doesn';t always wear he O2 with this walk.   ROV 03/03/16 -- This is a follow-up visit for history of sarcoidosis with associated obstructive lung disease. She also has obstructive sleep apnea and is treated with CPAP, chronic hypoxemic respiratory failure treated with oxygen at 4 L/m. Since last time she did a brief trial of Anoro but did not benefit, has been back on Spiriva since. She is compliant with her O2 and CPAP. She benefits from the CPAP, fells better rested during the day. Her breathing is largely unchanged. She can exert some but would be dyspneic with a long walk. She is able to shop, do home chores. She uses albuterol rarely, once a month. Sh eis using flonase daily.    Review of Systems  Constitutional: Negative for fever and unexpected weight change.  HENT: Positive for congestion. Negative for dental problem, ear pain, nosebleeds, postnasal  drip, rhinorrhea, sinus pressure, sneezing, sore throat and trouble swallowing.   Eyes: Negative for redness and itching.  Respiratory: Positive for shortness of breath. Negative for cough, chest tightness and wheezing.   Cardiovascular: Negative for chest pain, palpitations and leg swelling.  Gastrointestinal: Negative for nausea and vomiting.  Genitourinary: Negative for dysuria.  Musculoskeletal: Negative for joint swelling.  Skin: Negative for rash.  Neurological: Negative for headaches.  Hematological: Does not bruise/bleed easily.  Psychiatric/Behavioral: Negative for dysphoric mood. The patient is not nervous/anxious.        Objective:   Physical Exam Vitals:   03/03/16 0934  BP: (!) 142/86  BP Location: Left Arm  Cuff Size: Normal  Pulse: 88  SpO2: 98%  Weight: 242 lb (109.8 kg)  Height: 4\' 11"  (1.499 m)   Gen: Pleasant, obese, in no distress,  normal affect  ENT: No lesions,  mouth clear,  oropharynx clear, no postnasal drip  Neck: No JVD, no TMG, no carotid bruits  Lungs: No use of accessory muscles, no dullness to percussion, clear without rales or rhonchi  Cardiovascular: RRR, heart sounds normal, no murmur or gallops, no peripheral edema  Musculoskeletal: No deformities, no cyanosis or clubbing  Neuro: alert, non focal  Skin: Warm, no lesions or rashes   08/23/14 --   COMPARISON: Chest CT 11/13/2011.  FINDINGS: Mediastinum/Lymph Nodes: Heart size is borderline enlarged. There is no significant pericardial fluid, thickening or pericardial calcification. Marked dilatation of the pulmonic trunk (4.0 cm in diameter), suggesting  pulmonary arterial hypertension. No pathologically enlarged mediastinal or hilar lymph nodes. Please note that accurate exclusion of hilar adenopathy is limited on noncontrast CT scans. Esophagus is unremarkable. Incidental note is made of an aberrant right subclavian artery (normal anatomical variant). No associated  diverticulum of Kommerell. No axillary adenopathy.  Lungs/Pleura: High-resolution images demonstrate some peribronchovascular ground-glass attenuation, reticulation and architectural distortion in the lower lobes of the lungs bilaterally (left much greater than right). In these same regions there is some mild cylindrical bronchiectasis. A few other patchy areas of very mild ground-glass attenuation and subtle subpleural reticulation is noted, however, this is not a predominant finding, and appears randomly distributed. No honeycombing. Inspiratory and expiratory imaging is unremarkable.  Upper Abdomen: Unremarkable.  Musculoskeletal/Soft Tissues: There are no aggressive appearing lytic or blastic lesions noted in the visualized portions of the skeleton.  IMPRESSION: 1. There are no definite fibrotic changes in the lower lobes of the lungs bilaterally (left lower lobe to a much greater extent than the right). These findings are rather isolated, however, and are strongly favored to be areas of post infectious/inflammatory fibrosis. No other characteristic findings to suggest interstitial lung disease are noted at this time. 2. Marked dilatation of the pulmonic trunk, suggestive of pulmonary arterial hypertension. 3. Additional incidental findings, as above.      Assessment & Plan:  OSA (obstructive sleep apnea) Continue current CPAP. Tolerating well and benefiting from it. Good compliance.  COPD (chronic obstructive pulmonary disease) To use Spiriva. She did not tolerate Anoro. Continue albuterol as needed. Flu shot today.  Allergic rhinitis Continue daily fluticasone nasal spray.  Obesity hypoventilation syndrome Continue oxygen at all times.  Levy Pupaobert Jasiah Buntin, MD, PhD 03/03/2016, 9:52 AM [facility] [phone] or if no answer [phone]

## 2016-03-03 NOTE — Assessment & Plan Note (Signed)
Continue oxygen at all times 

## 2016-03-03 NOTE — Assessment & Plan Note (Signed)
Continue daily fluticasone nasal spray.

## 2016-03-03 NOTE — Patient Instructions (Signed)
Please continue your oxygen and CPAP as you are using them  Continue your Spiriva daily Keep albuterol available to use up to every 4 hours if needed for shortness of breath.  Continue your flonase spray every day.  Get the flu shot today You will need a repeat CT scan of your chest next year.  Follow with Dr Delton CoombesByrum in 4 months or sooner if you have any problems

## 2016-03-03 NOTE — Assessment & Plan Note (Signed)
Continue current CPAP. Tolerating well and benefiting from it. Good compliance.

## 2016-03-12 DIAGNOSIS — R079 Chest pain, unspecified: Secondary | ICD-10-CM

## 2016-03-12 HISTORY — DX: Chest pain, unspecified: R07.9

## 2016-05-15 ENCOUNTER — Other Ambulatory Visit: Payer: Self-pay | Admitting: Emergency Medicine

## 2016-06-12 ENCOUNTER — Other Ambulatory Visit: Payer: Self-pay | Admitting: Emergency Medicine

## 2016-07-07 ENCOUNTER — Ambulatory Visit (INDEPENDENT_AMBULATORY_CARE_PROVIDER_SITE_OTHER): Payer: Medicare Other | Admitting: Emergency Medicine

## 2016-07-07 ENCOUNTER — Encounter: Payer: Self-pay | Admitting: Emergency Medicine

## 2016-07-07 DIAGNOSIS — E662 Morbid (severe) obesity with alveolar hypoventilation: Secondary | ICD-10-CM

## 2016-07-07 DIAGNOSIS — J449 Chronic obstructive pulmonary disease, unspecified: Secondary | ICD-10-CM | POA: Diagnosis not present

## 2016-07-07 DIAGNOSIS — J209 Acute bronchitis, unspecified: Secondary | ICD-10-CM | POA: Diagnosis not present

## 2016-07-07 DIAGNOSIS — G4733 Obstructive sleep apnea (adult) (pediatric): Secondary | ICD-10-CM | POA: Diagnosis not present

## 2016-07-07 MED ORDER — DOXYCYCLINE HYCLATE 100 MG PO TABS: 100.0000 mg | ORAL_TABLET | Freq: Two times a day (BID) | ORAL | 0 refills | Status: DC

## 2016-07-07 NOTE — Assessment & Plan Note (Signed)
Continue Spiriva once a day. Continue albuterol as needed.

## 2016-07-07 NOTE — Assessment & Plan Note (Signed)
She has been off CPAP recently due to the upper respiratory infection. I have encouraged her to restart as soon as possible.

## 2016-07-07 NOTE — Progress Notes (Signed)
Subjective:    Patient ID: Mackenzie Johnson, female    DOB: 28-Sep-1960, 56 y.o.   MRN: [id number]  HPI  ROV 03/28/15 -- follow-up visit for obstructive lung disease, sarcoidosis dx by mediastinoscopy (Dr Edwyna ShellBurney), obstructive sleep apnea on CPAP. Last seen 6 months ago. She has chronic hypoxemic resp failure on 2L/min. She is on fluticasone NS and loratadine. She is on spiriva qd, uses albuterol prn about once a day. She reports that she has more problems on humid days. She has dry mouth and dyspnea with grocery shopping. No exacerbations.  She is compliant with and is benefiting from her CPAP.   ROV 09/03/15 -- patient has a history of sarcoidosis diagnosed by mediastinoscopy. She has associated obstructive lung disease. She also is on CPAP for obstructive sleep apnea. She has chronic hypoxemic respiratory failure and uses 4 L/m. She also has allergic rhinitis for which she is on flonase, uses 1-2x a day depending on how she feels. She is currently using Spiriva once a day and albuterol rarely. She has some exertional SOB with long walks and hills. She doesn';t always wear he O2 with this walk.   ROV 03/03/16 -- This is a follow-up visit for history of sarcoidosis with associated obstructive lung disease. She also has obstructive sleep apnea and is treated with CPAP, chronic hypoxemic respiratory failure treated with oxygen at 4 L/m. Since last time she did a brief trial of Anoro but did not benefit, has been back on Spiriva since. She is compliant with her O2 and CPAP. She benefits from the CPAP, fells better rested during the day. Her breathing is largely unchanged. She can exert some but would be dyspneic with a long walk. She is able to shop, do home chores. She uses albuterol rarely, once a month. Sh eis using flonase daily.   ROV 07/07/16 -- Patient has a history of sarcoidosis, COPD, sleep apnea on CPAP, chronic hypoxemic respiratory failure. We have tried Anoro in the past but she feels that she gets  more benefit from Spiriva. Last CT scan was 07/2014. She has been struggling in the last month because she had a URI, possibly the flu. She is finally starting to improve some. She started mucinex and dayquil. Her sinus congestion is a bit better. She is coughing up yellow green mucous. She has had some low grade fever. Minimal wheeze. Uses SABA about once a week. O2 is stable on 4L/min. Good compliance CPAP usually, has been interrupted by the recent URI.    Review of Systems  Constitutional: Negative for fever and unexpected weight change.  HENT: Positive for congestion. Negative for dental problem, ear pain, nosebleeds, postnasal drip, rhinorrhea, sinus pressure, sneezing, sore throat and trouble swallowing.   Eyes: Negative for redness and itching.  Respiratory: Positive for shortness of breath. Negative for cough, chest tightness and wheezing.   Cardiovascular: Negative for chest pain, palpitations and leg swelling.  Gastrointestinal: Negative for nausea and vomiting.  Genitourinary: Negative for dysuria.  Musculoskeletal: Negative for joint swelling.  Skin: Negative for rash.  Neurological: Negative for headaches.  Hematological: Does not bruise/bleed easily.  Psychiatric/Behavioral: Negative for dysphoric mood. The patient is not nervous/anxious.        Objective:   Physical Exam Vitals:   07/07/16 1021  BP: 130/78  BP Location: Right Arm  Cuff Size: Normal  Pulse: 82  SpO2: 96%  Weight: 242 lb (109.8 kg)  Height: 4\' 10"  (1.473 m)   Gen: Pleasant, obese, in no distress,  normal affect  ENT: No lesions,  mouth clear,  oropharynx clear, no postnasal drip  Neck: No JVD, no TMG, no carotid bruits  Lungs: No use of accessory muscles, no dullness to percussion, clear without rales or rhonchi  Cardiovascular: RRR, heart sounds normal, no murmur or gallops, no peripheral edema  Musculoskeletal: No deformities, no cyanosis or clubbing  Neuro: alert, non focal  Skin: Warm, no  lesions or rashes   08/23/14 --   COMPARISON: Chest CT 11/13/2011.  FINDINGS: Mediastinum/Lymph Nodes: Heart size is borderline enlarged. There is no significant pericardial fluid, thickening or pericardial calcification. Marked dilatation of the pulmonic trunk (4.0 cm in diameter), suggesting pulmonary arterial hypertension. No pathologically enlarged mediastinal or hilar lymph nodes. Please note that accurate exclusion of hilar adenopathy is limited on noncontrast CT scans. Esophagus is unremarkable. Incidental note is made of an aberrant right subclavian artery (normal anatomical variant). No associated diverticulum of Kommerell. No axillary adenopathy.  Lungs/Pleura: High-resolution images demonstrate some peribronchovascular ground-glass attenuation, reticulation and architectural distortion in the lower lobes of the lungs bilaterally (left much greater than right). In these same regions there is some mild cylindrical bronchiectasis. A few other patchy areas of very mild ground-glass attenuation and subtle subpleural reticulation is noted, however, this is not a predominant finding, and appears randomly distributed. No honeycombing. Inspiratory and expiratory imaging is unremarkable.  Upper Abdomen: Unremarkable.  Musculoskeletal/Soft Tissues: There are no aggressive appearing lytic or blastic lesions noted in the visualized portions of the skeleton.  IMPRESSION: 1. There are no definite fibrotic changes in the lower lobes of the lungs bilaterally (left lower lobe to a much greater extent than the right). These findings are rather isolated, however, and are strongly favored to be areas of post infectious/inflammatory fibrosis. No other characteristic findings to suggest interstitial lung disease are noted at this time. 2. Marked dilatation of the pulmonic trunk, suggestive of pulmonary arterial hypertension. 3. Additional incidental findings, as above.       Assessment & Plan:  OSA (obstructive sleep apnea) She has been off CPAP recently due to the upper respiratory infection. I have encouraged her to restart as soon as possible.  COPD (chronic obstructive pulmonary disease) Continue Spiriva once a day. Continue albuterol as needed.  Sarcoidosis Most recent CT scan of the chest was February 2016. Consider repeat in the coming year for surveillance  Obesity hypoventilation syndrome O2 at  4 L/m at all times.  Acute bronchitis In the setting of a recent viral upper respiratory infection. I believe she needs to be treated with a course of antibiotics. Ordered doxycycline for 7 days.  Levy Pupa, MD, PhD 07/07/2016, 10:47 AM [facility] [phone] or if no answer [phone]

## 2016-07-07 NOTE — Patient Instructions (Addendum)
Please take doxycycline 100mg  twice a day for 7 days.  Please continue your mucinex and decongestants as you aret taking them  Continue Spiriva daily Take albuterol 2 puffs up to every 4 hours if needed for shortness of breath.  Restart your CPAP every night Continue your oxygen at 4l/min.  Follow with Dr Delton CoombesByrum in 4 months or sooner if you have any problems.

## 2016-07-07 NOTE — Assessment & Plan Note (Signed)
O2 at  4 L/m at all times.

## 2016-07-07 NOTE — Assessment & Plan Note (Signed)
In the setting of a recent viral upper respiratory infection. I believe she needs to be treated with a course of antibiotics. Ordered doxycycline for 7 days.

## 2016-07-07 NOTE — Assessment & Plan Note (Signed)
Most recent CT scan of the chest was February 2016. Consider repeat in the coming year for surveillance

## 2016-09-04 ENCOUNTER — Other Ambulatory Visit: Payer: Self-pay | Admitting: Emergency Medicine

## 2016-11-05 ENCOUNTER — Encounter: Payer: Self-pay | Admitting: Emergency Medicine

## 2016-11-05 ENCOUNTER — Ambulatory Visit (INDEPENDENT_AMBULATORY_CARE_PROVIDER_SITE_OTHER): Payer: Medicare Other | Admitting: Emergency Medicine

## 2016-11-05 VITALS — BP 128/78 | HR 83 | Wt 242.8 lb

## 2016-11-05 DIAGNOSIS — G4733 Obstructive sleep apnea (adult) (pediatric): Secondary | ICD-10-CM | POA: Diagnosis not present

## 2016-11-05 DIAGNOSIS — J449 Chronic obstructive pulmonary disease, unspecified: Secondary | ICD-10-CM | POA: Diagnosis not present

## 2016-11-05 DIAGNOSIS — D869 Sarcoidosis, unspecified: Secondary | ICD-10-CM | POA: Diagnosis not present

## 2016-11-05 DIAGNOSIS — J301 Allergic rhinitis due to pollen: Secondary | ICD-10-CM

## 2016-11-05 DIAGNOSIS — E662 Morbid (severe) obesity with alveolar hypoventilation: Secondary | ICD-10-CM

## 2016-11-05 NOTE — Assessment & Plan Note (Signed)
Her CPAP machine is not operating. She needs a new one and ordered restart therapy. We will work on this. Need to find her old sleep study to document her OSA, order the appropriate pressure.

## 2016-11-05 NOTE — Assessment & Plan Note (Signed)
Continue her current Spiriva as ordered. Albuterol as needed. No evidence of acute exacerbation.

## 2016-11-05 NOTE — Progress Notes (Signed)
Subjective:    Patient ID: Mackenzie Johnson, female    DOB: 12-Nov-1960, 56 y.o.   MRN: [id number]  HPI  ROV 07/07/16 -- Patient has a history of sarcoidosis, COPD, sleep apnea on CPAP, chronic hypoxemic respiratory failure. We have tried Anoro in the past but she feels that she gets more benefit from Spiriva. Last CT scan was 07/2014. She has been struggling in the last month because she had a URI, possibly the flu. She is finally starting to improve some. She started mucinex and dayquil. Her sinus congestion is a bit better. She is coughing up yellow green mucous. She has had some low grade fever. Minimal wheeze. Uses SABA about once a week. O2 is stable on 4L/min. Good compliance CPAP usually, has been interrupted by the recent URI.   ROV 11/05/16 -- this follow-up visit for patient with a history of sarcoidosis (diagnosed by mediastinoscopy) and associated COPD, obstructive sleep apnea, chronic hypoxemic respiratory failure. She reports that her CPAP machine quit operating correctly about 2 months ago. She had been using reliably since last time until it broke. She is having a lot of nasal congestion and drainage this Spring - HA, occasional wheeze. She is on loratadine, flonase once a day. She is on spiriva, also uses albuterol prn about once a week.    Review of Systems  Constitutional: Negative for fever and unexpected weight change.  HENT: Positive for congestion. Negative for dental problem, ear pain, nosebleeds, postnasal drip, rhinorrhea, sinus pressure, sneezing, sore throat and trouble swallowing.   Eyes: Negative for redness and itching.  Respiratory: Positive for shortness of breath. Negative for cough, chest tightness and wheezing.   Cardiovascular: Negative for chest pain, palpitations and leg swelling.  Gastrointestinal: Negative for nausea and vomiting.  Genitourinary: Negative for dysuria.  Musculoskeletal: Negative for joint swelling.  Skin: Negative for rash.  Neurological:  Negative for headaches.  Hematological: Does not bruise/bleed easily.  Psychiatric/Behavioral: Negative for dysphoric mood. The patient is not nervous/anxious.        Objective:   Physical Exam Vitals:   11/05/16 0956  BP: 128/78  Pulse: 83  SpO2: 96%  Weight: 242 lb 12.8 oz (110.1 kg)   Gen: Pleasant, obese, in no distress,  normal affect  ENT: No lesions,  mouth clear,  oropharynx clear, no postnasal drip  Neck: No JVD, no TMG, no carotid bruits  Lungs: No use of accessory muscles, no dullness to percussion, clear without rales or rhonchi  Cardiovascular: RRR, heart sounds normal, no murmur or gallops, no peripheral edema  Musculoskeletal: No deformities, no cyanosis or clubbing  Neuro: alert, non focal  Skin: Warm, no lesions or rashes   08/23/14 --   COMPARISON: Chest CT 11/13/2011.  FINDINGS: Mediastinum/Lymph Nodes: Heart size is borderline enlarged. There is no significant pericardial fluid, thickening or pericardial calcification. Marked dilatation of the pulmonic trunk (4.0 cm in diameter), suggesting pulmonary arterial hypertension. No pathologically enlarged mediastinal or hilar lymph nodes. Please note that accurate exclusion of hilar adenopathy is limited on noncontrast CT scans. Esophagus is unremarkable. Incidental note is made of an aberrant right subclavian artery (normal anatomical variant). No associated diverticulum of Kommerell. No axillary adenopathy.  Lungs/Pleura: High-resolution images demonstrate some peribronchovascular ground-glass attenuation, reticulation and architectural distortion in the lower lobes of the lungs bilaterally (left much greater than right). In these same regions there is some mild cylindrical bronchiectasis. A few other patchy areas of very mild ground-glass attenuation and subtle subpleural reticulation is noted, however,  this is not a predominant finding, and appears randomly distributed. No honeycombing.  Inspiratory and expiratory imaging is unremarkable.  Upper Abdomen: Unremarkable.  Musculoskeletal/Soft Tissues: There are no aggressive appearing lytic or blastic lesions noted in the visualized portions of the skeleton.  IMPRESSION: 1. There are no definite fibrotic changes in the lower lobes of the lungs bilaterally (left lower lobe to a much greater extent than the right). These findings are rather isolated, however, and are strongly favored to be areas of post infectious/inflammatory fibrosis. No other characteristic findings to suggest interstitial lung disease are noted at this time. 2. Marked dilatation of the pulmonic trunk, suggestive of pulmonary arterial hypertension. 3. Additional incidental findings, as above.      Assessment & Plan:  OSA (obstructive sleep apnea) Her CPAP machine is not operating. She needs a new one and ordered restart therapy. We will work on this. Need to find her old sleep study to document her OSA, order the appropriate pressure.  Allergic rhinitis Continue loratadine. Encouraged her to consider restarting nasal saline washes. Try to increase with exam nasal spray to twice a day during the allergy season.  COPD (chronic obstructive pulmonary disease) Continue her current Spiriva as ordered. Albuterol as needed. No evidence of acute exacerbation.  Sarcoidosis Bibasilar crackles on exam, no wheezing. She is due for a repeat CT scan of her chest and we will arrange for this.  Obesity hypoventilation syndrome Oxygen at all times as ordered.  Levy Pupaobert Chanell Nadeau, MD, PhD 11/05/2016, 10:26 AM [facility] [phone] or if no answer [phone]

## 2016-11-05 NOTE — Patient Instructions (Addendum)
Please continue your flonase nasal spray. We will try to increase to twice a day during the allergy season Continue loratadine 10mg  daily Continue spiriva once a day. Use albuterol 2 puffs as needed for shortness of breath We will work on getting you a new CPAP machine. Restart using every night when we obtain this.  We will repeat your CT scan of your chest without contrast to follow your sarcoidosis.  Follow with Dr Delton CoombesByrum in 3 months or sooner if you have any problems.

## 2016-11-05 NOTE — Assessment & Plan Note (Signed)
Oxygen at all times as ordered.

## 2016-11-05 NOTE — Assessment & Plan Note (Signed)
Continue loratadine. Encouraged her to consider restarting nasal saline washes. Try to increase with exam nasal spray to twice a day during the allergy season.

## 2016-11-05 NOTE — Assessment & Plan Note (Signed)
Bibasilar crackles on exam, no wheezing. She is due for a repeat CT scan of her chest and we will arrange for this.

## 2016-11-13 ENCOUNTER — Inpatient Hospital Stay: Admission: RE | Admit: 2016-11-13 | Payer: Medicare Other | Source: Ambulatory Visit

## 2016-11-17 ENCOUNTER — Ambulatory Visit (INDEPENDENT_AMBULATORY_CARE_PROVIDER_SITE_OTHER)
Admission: RE | Admit: 2016-11-17 | Discharge: 2016-11-17 | Disposition: A | Discharge status: Home / Self Care | Payer: Medicare Other | Source: Ambulatory Visit | Attending: Emergency Medicine | Admitting: Emergency Medicine

## 2016-11-17 DIAGNOSIS — D869 Sarcoidosis, unspecified: Secondary | ICD-10-CM

## 2016-12-29 ENCOUNTER — Other Ambulatory Visit: Payer: Self-pay | Admitting: Emergency Medicine

## 2017-01-06 ENCOUNTER — Ambulatory Visit (HOSPITAL_BASED_OUTPATIENT_CLINIC_OR_DEPARTMENT_OTHER): Payer: Medicare Other | Attending: Emergency Medicine | Admitting: Pulmonary Disease

## 2017-01-06 VITALS — Ht <= 58 in | Wt 245.0 lb

## 2017-01-06 DIAGNOSIS — E662 Morbid (severe) obesity with alveolar hypoventilation: Secondary | ICD-10-CM

## 2017-01-06 DIAGNOSIS — J449 Chronic obstructive pulmonary disease, unspecified: Secondary | ICD-10-CM

## 2017-01-06 DIAGNOSIS — D869 Sarcoidosis, unspecified: Secondary | ICD-10-CM

## 2017-01-06 DIAGNOSIS — G4730 Sleep apnea, unspecified: Secondary | ICD-10-CM

## 2017-01-06 DIAGNOSIS — J9611 Chronic respiratory failure with hypoxia: Secondary | ICD-10-CM

## 2017-01-06 DIAGNOSIS — G4733 Obstructive sleep apnea (adult) (pediatric): Secondary | ICD-10-CM | POA: Diagnosis present

## 2017-01-13 DIAGNOSIS — G4730 Sleep apnea, unspecified: Secondary | ICD-10-CM

## 2017-01-13 DIAGNOSIS — J9611 Chronic respiratory failure with hypoxia: Secondary | ICD-10-CM | POA: Insufficient documentation

## 2017-01-13 HISTORY — DX: Chronic respiratory failure with hypoxia: J96.11

## 2017-01-13 NOTE — Procedures (Signed)
   Patient Name: Mackenzie Johnson, [name] Date: 01/06/2017 Gender: Female D.O.B: 03-Nov-1960 Age (years): 56 Referring Provider: Levy Pupaobert Byrum Height (inches): 58 Interpreting Physician: Coralyn HellingVineet Nole Robey MD, ABSM Weight (lbs): 245 RPSGT: Shelah LewandowskyGregory, Kenyon BMI: 51 MRN: [id number] Neck Size: 16.00  CLINICAL INFORMATION Sleep Study Type: NPSG  Indication for sleep study: COPD, Diabetes, Fatigue, Hypertension, Morning Headaches, Obesity, OSA, Re-Evaluation, Snoring, Witnessed Apneas  Epworth Sleepiness Score: 11  SLEEP STUDY TECHNIQUE As per the AASM Manual for the Scoring of Sleep and Associated Events v2.3 (April 2016) with a hypopnea requiring 4% desaturations.  The channels recorded and monitored were frontal, central and occipital EEG, electrooculogram (EOG), submentalis EMG (chin), nasal and oral airflow, thoracic and abdominal wall motion, anterior tibialis EMG, snore microphone, electrocardiogram, and pulse oximetry.  MEDICATIONS Medications self-administered by patient taken the night of the study : LANTUS, FISH OIL, TRAZODONE, SERTRALINE  SLEEP ARCHITECTURE The study was initiated at 10:41:11 PM and ended at 4:47:27 AM.  Sleep onset time was 40.8 minutes and the sleep efficiency was 84.1%. The total sleep time was 308.0 minutes.  Stage REM latency was 233.0 minutes.  The patient spent 9.42% of the night in stage N1 sleep, 81.98% in stage N2 sleep, 0.00% in stage N3 and 8.60% in REM.  Alpha intrusion was absent.  Supine sleep was 52.27%.  RESPIRATORY PARAMETERS The overall respiratory disturbance index (RDI) was 7.2 per hour. There were 18 total apneas, including 18 obstructive, 0 central and 0 mixed apneas. There were 1 hypopneas and 18 RERAs.  The AHI during Stage REM sleep was 34.0 per hour.  AHI while supine was 6.7 per hour.  The mean oxygen saturation was 98.44%. The minimum SpO2 during sleep was 94.00%.  The study was conducted with the patient using 4 liters  supplemental oxygen.  Moderate snoring was noted during this study.  CARDIAC DATA The 2 lead EKG demonstrated sinus rhythm. The mean heart rate was 51.99 beats per minute. Other EKG findings include: None.  LEG MOVEMENT DATA The total PLMS were 17 with a resulting PLMS index of 3.31. Associated arousal with leg movement index was 0.0 .  IMPRESSIONS - This study shows mild sleep apnea with an RDI of 7.2. - The SpO2 low was 94%.  The study was conducted with the patient using 4 liters supplemental oxygen.  DIAGNOSIS - Sleep apnea, unspecified (G47.30) - COPD (J44.9) - Sarcoidosis (D86.9) - Chronic respiratory failure with hypoxia (J96.11) - Obesity hypoventilation syndrome (E66.2)  RECOMMENDATIONS - She should return the sleep lab for a CPAP titration study.    [Electronically signed] 01/13/2017 08:39 AM  Coralyn HellingVineet Patrisha Hausmann MD, ABSM Diplomate, American Board of Sleep Medicine   NPI: [id number]

## 2017-01-26 ENCOUNTER — Other Ambulatory Visit: Payer: Self-pay | Admitting: Emergency Medicine

## 2017-02-19 ENCOUNTER — Other Ambulatory Visit: Payer: Self-pay | Admitting: Emergency Medicine

## 2017-02-23 ENCOUNTER — Encounter: Payer: Self-pay | Admitting: Emergency Medicine

## 2017-02-23 ENCOUNTER — Other Ambulatory Visit: Payer: Self-pay | Admitting: Emergency Medicine

## 2017-02-23 ENCOUNTER — Ambulatory Visit (INDEPENDENT_AMBULATORY_CARE_PROVIDER_SITE_OTHER): Payer: Medicare Other | Admitting: Emergency Medicine

## 2017-02-23 VITALS — BP 124/76 | HR 80 | Ht <= 58 in | Wt 242.8 lb

## 2017-02-23 DIAGNOSIS — K219 Gastro-esophageal reflux disease without esophagitis: Secondary | ICD-10-CM | POA: Diagnosis not present

## 2017-02-23 DIAGNOSIS — Z23 Encounter for immunization: Secondary | ICD-10-CM | POA: Diagnosis not present

## 2017-02-23 DIAGNOSIS — J449 Chronic obstructive pulmonary disease, unspecified: Secondary | ICD-10-CM

## 2017-02-23 DIAGNOSIS — G4730 Sleep apnea, unspecified: Secondary | ICD-10-CM

## 2017-02-23 DIAGNOSIS — J301 Allergic rhinitis due to pollen: Secondary | ICD-10-CM

## 2017-02-23 NOTE — Assessment & Plan Note (Signed)
Continue Spiriva, albuterol as needed. She is interested in getting a poor blocks and concentrator, lighter system because she is having difficulty carrying her current tanks. Interested in assessing her actual oxygen need with exertion. We will work on this today.

## 2017-02-23 NOTE — Assessment & Plan Note (Signed)
Nexium once a day

## 2017-02-23 NOTE — Assessment & Plan Note (Signed)
Confirmed on sleep study from July 2018. This was not done as a split night. We will order new equipment for her. I will change her order setting to AutoSet CPAP with a range 10-20 cm water

## 2017-02-23 NOTE — Patient Instructions (Signed)
We will order a new CPAP machine and equipment for you now that you have had a repeat sleep study Please continue Spiriva once a day Please continue to use albuterol 2 puffs as needed for shortness of breath We will attempt to change your oxygen to a portable oxygen concentrator, lighter set up that will be easier to carry. We will assess to see if 3 L/m pulsed oxygen is adequate while you are exerting yourself. Continue fluticasone nasal spray once a day, loratadine, Nexium. Follow with Dr Delton Coombes in 4 months or sooner if you have any problems.

## 2017-02-23 NOTE — Assessment & Plan Note (Signed)
Fluticasone nasal spray, loratadine 

## 2017-02-23 NOTE — Progress Notes (Signed)
Subjective:    Patient ID: Mackenzie Johnson, female    DOB: 1961-03-20, 56 y.o.   MRN: [id number]  HPI  ROV 07/07/16 -- Patient has a history of sarcoidosis, COPD, sleep apnea on CPAP, chronic hypoxemic respiratory failure. We have tried Anoro in the past but she feels that she gets more benefit from Spiriva. Last CT scan was 07/2014. She has been struggling in the last month because she had a URI, possibly the flu. She is finally starting to improve some. She started mucinex and dayquil. Her sinus congestion is a bit better. She is coughing up yellow green mucous. She has had some low grade fever. Minimal wheeze. Uses SABA about once a week. O2 is stable on 4L/min. Good compliance CPAP usually, has been interrupted by the recent URI.   ROV 11/05/16 -- this follow-up visit for patient with a history of sarcoidosis (diagnosed by mediastinoscopy) and associated COPD, obstructive sleep apnea, chronic hypoxemic respiratory failure. She reports that her CPAP machine quit operating correctly about 2 months ago. She had been using reliably since last time until it broke. She is having a lot of nasal congestion and drainage this Spring - HA, occasional wheeze. She is on loratadine, flonase once a day. She is on spiriva, also uses albuterol prn about once a week.   ROV 02/23/17 -- Patient has a history of sarcoidosis with associated obstructive lung disease. She also has chronic hypoxemic respiratory failure and obstructive sleep apnea, allergic rhinitis. We repeated a PSG in July 2018 - confirmed OSA. Need to use this to get her a new machine. She is interested in seeing if she can go to 3L/min pulsed w exertion, get a lighter device, POC. Remains on spiriva, albuterol about daily. She uses flonase qd and loratadine. Nexium qd   Review of Systems  Constitutional: Negative for fever and unexpected weight change.  HENT: Positive for congestion. Negative for dental problem, ear pain, nosebleeds, postnasal drip,  rhinorrhea, sinus pressure, sneezing, sore throat and trouble swallowing.   Eyes: Negative for redness and itching.  Respiratory: Positive for shortness of breath. Negative for cough, chest tightness and wheezing.   Cardiovascular: Negative for chest pain, palpitations and leg swelling.  Gastrointestinal: Negative for nausea and vomiting.  Genitourinary: Negative for dysuria.  Musculoskeletal: Negative for joint swelling.  Skin: Negative for rash.  Neurological: Negative for headaches.  Hematological: Does not bruise/bleed easily.  Psychiatric/Behavioral: Negative for dysphoric mood. The patient is not nervous/anxious.        Objective:   Physical Exam Vitals:   02/23/17 1122  BP: 124/76  Pulse: 80  SpO2: 98%  Weight: 242 lb 12.8 oz (110.1 kg)  Height: 4\' 10"  (1.473 m)   Gen: Pleasant, obese, in no distress,  normal affect  ENT: No lesions,  mouth clear,  oropharynx clear, no postnasal drip  Neck: No JVD, no TMG, no carotid bruits  Lungs: No use of accessory muscles, no dullness to percussion, clear without rales or rhonchi  Cardiovascular: RRR, heart sounds normal, no murmur or gallops, no peripheral edema  Musculoskeletal: No deformities, no cyanosis or clubbing  Neuro: alert, non focal  Skin: Warm, no lesions or rashes   08/23/14 --   COMPARISON: Chest CT 11/13/2011.  FINDINGS: Mediastinum/Lymph Nodes: Heart size is borderline enlarged. There is no significant pericardial fluid, thickening or pericardial calcification. Marked dilatation of the pulmonic trunk (4.0 cm in diameter), suggesting pulmonary arterial hypertension. No pathologically enlarged mediastinal or hilar lymph nodes. Please note that accurate  exclusion of hilar adenopathy is limited on noncontrast CT scans. Esophagus is unremarkable. Incidental note is made of an aberrant right subclavian artery (normal anatomical variant). No associated diverticulum of Kommerell. No  axillary adenopathy.  Lungs/Pleura: High-resolution images demonstrate some peribronchovascular ground-glass attenuation, reticulation and architectural distortion in the lower lobes of the lungs bilaterally (left much greater than right). In these same regions there is some mild cylindrical bronchiectasis. A few other patchy areas of very mild ground-glass attenuation and subtle subpleural reticulation is noted, however, this is not a predominant finding, and appears randomly distributed. No honeycombing. Inspiratory and expiratory imaging is unremarkable.  Upper Abdomen: Unremarkable.  Musculoskeletal/Soft Tissues: There are no aggressive appearing lytic or blastic lesions noted in the visualized portions of the skeleton.  IMPRESSION: 1. There are no definite fibrotic changes in the lower lobes of the lungs bilaterally (left lower lobe to a much greater extent than the right). These findings are rather isolated, however, and are strongly favored to be areas of post infectious/inflammatory fibrosis. No other characteristic findings to suggest interstitial lung disease are noted at this time. 2. Marked dilatation of the pulmonic trunk, suggestive of pulmonary arterial hypertension. 3. Additional incidental findings, as above.      Assessment & Plan:  GERD (gastroesophageal reflux disease) Nexium once a day  Allergic rhinitis Fluticasone nasal spray, loratadine  Sleep apnea, unspecified Confirmed on sleep study from July 2018. This was not done as a split night. We will order new equipment for her. I will change her order setting to AutoSet CPAP with a range 10-20 cm water  COPD (chronic obstructive pulmonary disease) Continue Spiriva, albuterol as needed. She is interested in getting a poor blocks and concentrator, lighter system because she is having difficulty carrying her current tanks. Interested in assessing her actual oxygen need with exertion. We will work on  this today.  Levy Pupa, MD, PhD 02/23/2017, 11:43 AM [facility] [phone] or if no answer [phone]

## 2017-02-24 ENCOUNTER — Telehealth: Payer: Self-pay | Admitting: Emergency Medicine

## 2017-02-24 NOTE — Telephone Encounter (Signed)
Spoke with Rod HollerGilda, who advised to disregard this message.  Will close encounter.

## 2017-05-12 ENCOUNTER — Other Ambulatory Visit: Payer: Self-pay | Admitting: Emergency Medicine

## 2017-05-19 ENCOUNTER — Other Ambulatory Visit: Payer: Self-pay | Admitting: Emergency Medicine

## 2017-06-10 ENCOUNTER — Ambulatory Visit: Payer: Medicare Other | Admitting: Emergency Medicine

## 2017-06-10 ENCOUNTER — Other Ambulatory Visit: Payer: Self-pay | Admitting: Emergency Medicine

## 2017-06-10 ENCOUNTER — Encounter: Payer: Self-pay | Admitting: Emergency Medicine

## 2017-06-10 DIAGNOSIS — D869 Sarcoidosis, unspecified: Secondary | ICD-10-CM

## 2017-06-10 DIAGNOSIS — J9611 Chronic respiratory failure with hypoxia: Secondary | ICD-10-CM

## 2017-06-10 DIAGNOSIS — K219 Gastro-esophageal reflux disease without esophagitis: Secondary | ICD-10-CM

## 2017-06-10 DIAGNOSIS — J449 Chronic obstructive pulmonary disease, unspecified: Secondary | ICD-10-CM | POA: Diagnosis not present

## 2017-06-10 DIAGNOSIS — J301 Allergic rhinitis due to pollen: Secondary | ICD-10-CM | POA: Diagnosis not present

## 2017-06-10 DIAGNOSIS — G4730 Sleep apnea, unspecified: Secondary | ICD-10-CM

## 2017-06-10 NOTE — Assessment & Plan Note (Signed)
We will repeat her imaging, either chest x-ray or CT scan depending on her symptoms.  We will decide the mode at her next visit.

## 2017-06-10 NOTE — Assessment & Plan Note (Signed)
Continue loratadine, fluticasone nasal spray 

## 2017-06-10 NOTE — Assessment & Plan Note (Signed)
Continue Nexium as ordered 

## 2017-06-10 NOTE — Assessment & Plan Note (Signed)
Continue Spiriva every day Keep albuterol available to use if needed for shortness of breath, chest tightness, wheezing Flu shot up-to-date You would benefit from getting the Prevnar 13 pneumonia shot at some point if you have not already had it at one of your other providers

## 2017-06-10 NOTE — Progress Notes (Signed)
Subjective:    Patient ID: Mackenzie Johnson, female    DOB: [birth year], 58 y.o.   MRN: [id number]  HPI  ROV 07/07/16 -- Patient has a history of sarcoidosis, COPD, sleep apnea on CPAP, chronic hypoxemic respiratory failure. We have tried Anoro in the past but she feels that she gets more benefit from Spiriva. Last CT scan was 07/2014. She has been struggling in the last month because she had a URI, possibly the flu. She is finally starting to improve some. She started mucinex and dayquil. Her sinus congestion is a bit better. She is coughing up yellow green mucous. She has had some low grade fever. Minimal wheeze. Uses SABA about once a week. O2 is stable on 4L/min. Good compliance CPAP usually, has been interrupted by the recent URI.   ROV 11/05/16 -- this follow-up visit for patient with a history of sarcoidosis (diagnosed by mediastinoscopy) and associated COPD, obstructive sleep apnea, chronic hypoxemic respiratory failure. She reports that her CPAP machine quit operating correctly about 2 months ago. She had been using reliably since last time until it broke. She is having a lot of nasal congestion and drainage this Spring - HA, occasional wheeze. She is on loratadine, flonase once a day. She is on spiriva, also uses albuterol prn about once a week.   ROV 02/23/17 -- Patient has a history of sarcoidosis with associated obstructive lung disease. She also has chronic hypoxemic respiratory failure and obstructive sleep apnea, allergic rhinitis. We repeated a PSG in July 2018 - confirmed OSA. Need to use this to get her a new machine. She is interested in seeing if she can go to 3L/min pulsed w exertion, get a lighter device, POC. Remains on spiriva, albuterol about daily. She uses flonase qd and loratadine. Nexium qd  ROV 06/10/17 --56 year old woman with sarcoidosis and associated COPD.  She also has sleep apnea on CPAP and chronic hypoxemic respiratory failure, allergic rhinitis. She just got her POC, is  using at 4L/min pulsed. We changed her CPAP to auto-set, she hasn't started using it yet because due to her father's illness and dementia she has not been able to sleep in the bed, instead on the couch. She reports that her breathing has been doing fairly well. She did have a URI last week that is mostly resolved. She is on loratadine, flonase. Also on nexium. Flu shot is up to date. Last CT chest last spring. n   Review of Systems  Constitutional: Negative for fever and unexpected weight change.  HENT: Positive for congestion. Negative for dental problem, ear pain, nosebleeds, postnasal drip, rhinorrhea, sinus pressure, sneezing, sore throat and trouble swallowing.   Eyes: Negative for redness and itching.  Respiratory: Positive for shortness of breath. Negative for cough, chest tightness and wheezing.   Cardiovascular: Negative for chest pain, palpitations and leg swelling.  Gastrointestinal: Negative for nausea and vomiting.  Genitourinary: Negative for dysuria.  Musculoskeletal: Negative for joint swelling.  Skin: Negative for rash.  Neurological: Negative for headaches.  Hematological: Does not bruise/bleed easily.  Psychiatric/Behavioral: Negative for dysphoric mood. The patient is not nervous/anxious.        Objective:   Physical Exam Vitals:   06/10/17 0915  BP: 140/70  Pulse: 84  SpO2: 99%  Weight: 254 lb (115.2 kg)  Height: 4\' 10"  (1.473 m)   Gen: Pleasant, obese, in no distress,  normal affect  ENT: No lesions,  mouth clear,  oropharynx clear, no postnasal drip  Neck: No JVD, no  TMG, no carotid bruits  Lungs: No use of accessory muscles, no dullness to percussion, clear without rales or rhonchi  Cardiovascular: RRR, heart sounds normal, no murmur or gallops, no peripheral edema  Musculoskeletal: No deformities, no cyanosis or clubbing  Neuro: alert, non focal  Skin: Warm, no lesions or rashes   08/23/14 --   COMPARISON: Chest CT  11/13/2011.  FINDINGS: Mediastinum/Lymph Nodes: Heart size is borderline enlarged. There is no significant pericardial fluid, thickening or pericardial calcification. Marked dilatation of the pulmonic trunk (4.0 cm in diameter), suggesting pulmonary arterial hypertension. No pathologically enlarged mediastinal or hilar lymph nodes. Please note that accurate exclusion of hilar adenopathy is limited on noncontrast CT scans. Esophagus is unremarkable. Incidental note is made of an aberrant right subclavian artery (normal anatomical variant). No associated diverticulum of Kommerell. No axillary adenopathy.  Lungs/Pleura: High-resolution images demonstrate some peribronchovascular ground-glass attenuation, reticulation and architectural distortion in the lower lobes of the lungs bilaterally (left much greater than right). In these same regions there is some mild cylindrical bronchiectasis. A few other patchy areas of very mild ground-glass attenuation and subtle subpleural reticulation is noted, however, this is not a predominant finding, and appears randomly distributed. No honeycombing. Inspiratory and expiratory imaging is unremarkable.  Upper Abdomen: Unremarkable.  Musculoskeletal/Soft Tissues: There are no aggressive appearing lytic or blastic lesions noted in the visualized portions of the skeleton.  IMPRESSION: 1. There are no definite fibrotic changes in the lower lobes of the lungs bilaterally (left lower lobe to a much greater extent than the right). These findings are rather isolated, however, and are strongly favored to be areas of post infectious/inflammatory fibrosis. No other characteristic findings to suggest interstitial lung disease are noted at this time. 2. Marked dilatation of the pulmonic trunk, suggestive of pulmonary arterial hypertension. 3. Additional incidental findings, as above.      Assessment & Plan:  Sleep apnea, unspecified Poor CPAP  compliance over the last several days but this is because she has been taking care of her father who has dementia.  Her sleep has been disrupted, she has been sleeping on the couch. She knows the importance of reinitiating and plans to do so.  COPD (chronic obstructive pulmonary disease) Continue Spiriva every day Keep albuterol available to use if needed for shortness of breath, chest tightness, wheezing Flu shot up-to-date You would benefit from getting the Prevnar 13 pneumonia shot at some point if you have not already had it at one of your other providers  Allergic rhinitis Continue loratadine, fluticasone nasal spray  Chronic respiratory failure with hypoxia (HCC) She has obtained a portable oxygen concentrator, is using 4 L/min pulsed with success  GERD (gastroesophageal reflux disease) Continue Nexium as ordered  Sarcoidosis We will repeat her imaging, either chest x-ray or CT scan depending on her symptoms.  We will decide the mode at her next visit.  Levy Pupaobert Dylann Layne, MD, PhD 06/10/2017, 9:36 AM [facility] [phone] or if no answer [phone]

## 2017-06-10 NOTE — Assessment & Plan Note (Signed)
Poor CPAP compliance over the last several days but this is because she has been taking care of her father who has dementia.  Her sleep has been disrupted, she has been sleeping on the couch. She knows the importance of reinitiating and plans to do so.

## 2017-06-10 NOTE — Assessment & Plan Note (Signed)
She has obtained a portable oxygen concentrator, is using 4 L/min pulsed with success

## 2017-06-10 NOTE — Patient Instructions (Addendum)
Work hard on getting back on your CPAP machine.  You will benefit by using it every night Continue Spiriva every day Keep albuterol available to use if needed for shortness of breath, chest tightness, wheezing Continue your oxygen at 4 L/min pulsed Flu shot up-to-date You would benefit from getting the Prevnar 13 pneumonia shot at some point if you have not already had it at one of your other providers We do not need to repeat your CT scan of the chest at this time Please continue fluticasone nasal spray, loratadine as you are taking them Please continue Nexium as you are taking it Follow with Dr Delton CoombesByrum in 6 months or sooner if you have any problems

## 2017-08-11 ENCOUNTER — Other Ambulatory Visit: Payer: Self-pay | Admitting: Emergency Medicine

## 2017-10-29 ENCOUNTER — Ambulatory Visit (INDEPENDENT_AMBULATORY_CARE_PROVIDER_SITE_OTHER)
Admission: RE | Admit: 2017-10-29 | Discharge: 2017-10-29 | Disposition: A | Discharge status: Home / Self Care | Payer: Medicare Other | Source: Ambulatory Visit | Attending: Adult Health | Admitting: Adult Health

## 2017-10-29 ENCOUNTER — Ambulatory Visit: Payer: Medicare Other | Admitting: Emergency Medicine

## 2017-10-29 ENCOUNTER — Ambulatory Visit: Payer: Medicare Other | Admitting: Adult Health

## 2017-10-29 ENCOUNTER — Encounter: Payer: Self-pay | Admitting: Adult Health

## 2017-10-29 VITALS — BP 128/86 | HR 60 | Temp 101.0°F | Ht <= 58 in | Wt 245.2 lb

## 2017-10-29 DIAGNOSIS — J440 Chronic obstructive pulmonary disease with acute lower respiratory infection: Secondary | ICD-10-CM | POA: Diagnosis not present

## 2017-10-29 DIAGNOSIS — J209 Acute bronchitis, unspecified: Secondary | ICD-10-CM

## 2017-10-29 DIAGNOSIS — J9611 Chronic respiratory failure with hypoxia: Secondary | ICD-10-CM | POA: Diagnosis not present

## 2017-10-29 LAB — POCT INFLUENZA A/B
INFLUENZA A, POC: NEGATIVE
INFLUENZA B, POC: NEGATIVE

## 2017-10-29 MED ORDER — LEVALBUTEROL HCL 0.63 MG/3ML IN NEBU
0.6300 mg | INHALATION_SOLUTION | Freq: Once | RESPIRATORY_TRACT | Status: AC
  Administered 2017-10-29: 0.63 mg via RESPIRATORY_TRACT

## 2017-10-29 MED ORDER — AMOXICILLIN-POT CLAVULANATE 875-125 MG PO TABS: 1.0000 | ORAL_TABLET | Freq: Two times a day (BID) | ORAL | 0 refills | Status: AC

## 2017-10-29 NOTE — Assessment & Plan Note (Signed)
Cont on o2 .  

## 2017-10-29 NOTE — Patient Instructions (Signed)
Begin Augmentin  Twice daily  For 1 week, take with food.  Mucinex DM Twice daily  .As needed  Cough/congestion  Fluids and rest  Chest xray today  Continue on Oxygen 3l/m .  Follow up with Dr. Delton Coombes  As planned and As needed   Please contact office for sooner follow up if symptoms do not improve or worsen or seek emergency care

## 2017-10-29 NOTE — Progress Notes (Signed)
  ID: Mackenzie Johnson, female    DOB: 1960-08-05, 57 y.o.   MRN: [id number]  Chief Complaint  Patient presents with  . Acute Visit    Cough     Referring provider: No ref. provider found  HPI: 57 year old female former smoker followed for sarcoid, COPD, sleep apnea and oxygen dependent respiratory failure  10/29/2017 Acute OV : Cough and fever  Patient presents for an acute office visit.  Complains of 3 days of cough,  congestion and fever.  Coughing up thick mucus .  Patient says brother has been sick with similar symptoms with the flu. Patient denies any chest pain, orthopnea, headache, neck pain, hemoptysis. Today in the office she has a fever of 101. Appetite is okay . No n/v.d.  Flu swab is negative today in the office  Remains on Spiriva daily.    Allergies  Allergen Reactions  . Sulfa Antibiotics Hives    Immunization History  Administered Date(s) Administered  . Influenza Split 06/29/2012, 03/21/2013  . Influenza,inj,Quad PF,6+ Mos 03/14/2014, 03/27/2015, 03/03/2016, 02/23/2017  . Pneumococcal Polysaccharide-23 06/29/2012    Past Medical History:  Diagnosis Date  . Anemia   . Arthritis   . Asthma   . COPD (chronic obstructive pulmonary disease) (HCC)   . Diabetes mellitus   . GERD (gastroesophageal reflux disease)   . Heart murmur   . Hypercholesteremia   . Hypertension   . Kidney failure   . Sarcoidosis   . Sleep apnea    CPAP    Tobacco History: Social History   Tobacco Use  Smoking Status Former Smoker  . Packs/day: 0.10  . Years: 2.00  . Pack years: 0.20  . Types: Cigarettes  . Last attempt to quit: 06/29/1976  . Years since quitting: 41.3  Smokeless Tobacco Never Used  Tobacco Comment   as a teenager   Counseling given: Not Answered Comment: as a teenager   Outpatient Encounter Medications as of 10/29/2017  Medication Sig  . albuterol (PROVENTIL) (2.5 MG/3ML) 0.083% nebulizer solution INHALE 1 VIAL VIA NEBULIZER EVERY 3 TO 4  HOURS AS NEEDED FOR SHORTNESS OF BREATH/WHEEZING  . albuterol (PROVENTIL) (2.5 MG/3ML) 0.083% nebulizer solution INHALE 1 VIAL VIA NEBULIZER EVERY 3 TO 4 HOURS AS NEEDED FOR SHORTNESS OF BREATH/WHEEZING  . esomeprazole (NEXIUM) 40 MG capsule Take 40 mg by mouth daily after lunch daily after lunch.  . fluticasone (FLONASE) 50 MCG/ACT nasal spray INSTILL 2 SPRAYS IN EACH NOSTRIL TWICE DAILY  . furosemide (LASIX) 40 MG tablet Take 40 mg by mouth daily.  Marland Kitchen gemfibrozil (LOPID) 600 MG tablet Take 600 mg by mouth 2 (two) times daily before a meal.  . insulin glargine (LANTUS) 100 UNIT/ML injection Inject 50 Units into the skin at bedtime.  Marland Kitchen loratadine (CLARITIN) 10 MG tablet TAKE 1 TABLET BY MOUTH ONCE DAILY  . nitroGLYCERIN (NITROSTAT) 0.4 MG SL tablet Place 0.4 mg under the tongue every 5 (five) minutes as needed for chest pain.  Marland Kitchen PROAIR HFA 108 (90 Base) MCG/ACT inhaler INHALE 2 PUFFS BY MOUTH EVERY 6 HOURS AS NEEDED FOR WHEEZING/SHORTNESS OF BREATH  . ranolazine (RANEXA) 500 MG 12 hr tablet Take 500 mg by mouth 2 (two) times daily.  . rosuvastatin (CRESTOR) 20 MG tablet Take 40 mg by mouth daily.   . sertraline (ZOLOFT) 100 MG tablet Take 100 mg by mouth daily.  Marland Kitchen SPIRIVA HANDIHALER 18 MCG inhalation capsule INHALE THE CONTENTS OF 1 CAPSULE VIA HANDIHALER BY MOUTH EVERY DAY  . temazepam (  RESTORIL) 15 MG capsule Take 15 mg by mouth at bedtime as needed for sleep.  . traZODone (DESYREL) 50 MG tablet Take 50 mg by mouth at bedtime.  Marland Kitchen amoxicillin-clavulanate (AUGMENTIN) 875-125 MG tablet Take 1 tablet by mouth 2 (two) times daily for 7 days.  . [DISCONTINUED] fluticasone (FLONASE) 50 MCG/ACT nasal spray Place 2 sprays into both nostrils 2 (two) times daily.   No facility-administered encounter medications on file as of 10/29/2017.      Review of Systems  Constitutional:   No  weight loss, night sweats,  Fevers, chills, fatigue, or  lassitude.  HEENT:   No headaches,  Difficulty swallowing,   Tooth/dental problems, or  Sore throat,                No sneezing, itching, ear ache,  +nasal congestion, post nasal drip,   CV:  No chest pain,  Orthopnea, PND, swelling in lower extremities, anasarca, dizziness, palpitations, syncope.   GI  No heartburn, indigestion, abdominal pain, nausea, vomiting, diarrhea, change in bowel habits, loss of appetite, bloody stools.   Resp:    No chest wall deformity  Skin: no rash or lesions.  GU: no dysuria, change in color of urine, no urgency or frequency.  No flank pain, no hematuria   MS:  No joint pain or swelling.  No decreased range of motion.  No back pain.    Physical Exam  BP 128/86 (BP Location: Left Arm, Cuff Size: Normal)   Pulse 60   Temp (!) 101 F (38.3 C) (Oral)   Ht  (1.473 m)   Wt 245 lb 3.2 oz (111.2 kg)   SpO2 99%   BMI 51.25 kg/m   GEN: A/Ox3; pleasant , NAD, obese , on O2    HEENT:  [place]/AT,  EACs-clear, TMs-wnl, NOSE-clear, THROAT-clear, no lesions, no postnasal drip or exudate noted.   NECK:  Supple w/ fair ROM; no JVD; normal carotid impulses w/o bruits; no thyromegaly or nodules palpated; no lymphadenopathy.    RESP  Clear  P & A; w/o, wheezes/ rales/ or rhonchi. no accessory muscle use, no dullness to percussion  CARD:  RRR, no m/r/g, tr  peripheral edema, pulses intact, no cyanosis or clubbing.  GI:   Soft & nt; nml bowel sounds; no organomegaly or masses detected.   Musco: Warm bil, no deformities or joint swelling noted.   Neuro: alert, no focal deficits noted.    Skin: Warm, no lesions or rashes    Lab Results:  CBC   BNP No results found for: BNP  ProBNP No results found for: PROBNP  Imaging: Dg Chest 2 View  Result Date: 10/29/2017 CLINICAL DATA:  Cough, congestion, dyspnea and fever for 3 days. History COPD/asthma, diabetes and sarcoidosis. Diagnosed with flu today. EXAM: CHEST - 2 VIEW COMPARISON:  Radiographs 03/09/2016.  CT 11/17/2016. FINDINGS: The heart size and  mediastinal contours are stable. There is chronic central enlargement of the pulmonary arteries and aortic atherosclerosis. There is mild central airway thickening without focal airspace disease, pleural effusion or pneumothorax. No acute osseous findings are seen. IMPRESSION: Stable chest with chronic central airway thickening, central enlargement of the pulmonary arteries consistent with pulmonary arterial hypertension and atherosclerosis. Electronically Signed   By: Carey Bullocks M.D.   On: 10/29/2017 13:04     Assessment & Plan:   COPD (chronic obstructive pulmonary disease) Mild flare with URI/bronchitis Xopenex neb given in office  Plan  Patient Instructions  Begin Augmentin  Twice  daily  For 1 week, take with food.  Mucinex DM Twice daily  .As needed  Cough/congestion  Fluids and rest  Chest xray today  Continue on Oxygen 3l/m .  Follow up with Dr. Delton Coombes  As planned and As needed   Please contact office for sooner follow up if symptoms do not improve or worsen or seek emergency care       Acute bronchitis Plan  Patient Instructions  Begin Augmentin  Twice daily  For 1 week, take with food.  Mucinex DM Twice daily  .As needed  Cough/congestion  Fluids and rest  Chest xray today  Continue on Oxygen 3l/m .  Follow up with Dr. Delton Coombes  As planned and As needed   Please contact office for sooner follow up if symptoms do not improve or worsen or seek emergency care       Chronic respiratory failure with hypoxia (HCC) Cont on o2      Chino Sardo, NP 10/29/2017

## 2017-10-29 NOTE — Addendum Note (Signed)
Addended by: Boone Master E on: 10/29/2017 05:12 PM   Modules accepted: Orders

## 2017-10-29 NOTE — Assessment & Plan Note (Addendum)
Mild flare with URI/bronchitis Xopenex neb given in office  Plan  Patient Instructions  Begin Augmentin  Twice daily  For 1 week, take with food.  Mucinex DM Twice daily  .As needed  Cough/congestion  Fluids and rest  Chest xray today  Continue on Oxygen 3l/m .  Follow up with Dr. Delton Coombes  As planned and As needed   Please contact office for sooner follow up if symptoms do not improve or worsen or seek emergency care

## 2017-10-29 NOTE — Assessment & Plan Note (Signed)
Plan  Patient Instructions  Begin Augmentin  Twice daily  For 1 week, take with food.  Mucinex DM Twice daily  .As needed  Cough/congestion  Fluids and rest  Chest xray today  Continue on Oxygen 3l/m .  Follow up with Dr. Delton Coombes  As planned and As needed   Please contact office for sooner follow up if symptoms do not improve or worsen or seek emergency care

## 2017-11-03 ENCOUNTER — Other Ambulatory Visit: Payer: Self-pay | Admitting: Emergency Medicine

## 2017-12-01 ENCOUNTER — Other Ambulatory Visit: Payer: Self-pay | Admitting: Emergency Medicine

## 2017-12-21 ENCOUNTER — Encounter: Payer: Self-pay | Admitting: Emergency Medicine

## 2017-12-21 ENCOUNTER — Ambulatory Visit: Payer: Medicare Other | Admitting: Emergency Medicine

## 2017-12-21 DIAGNOSIS — E662 Morbid (severe) obesity with alveolar hypoventilation: Secondary | ICD-10-CM | POA: Diagnosis not present

## 2017-12-21 DIAGNOSIS — J301 Allergic rhinitis due to pollen: Secondary | ICD-10-CM

## 2017-12-21 DIAGNOSIS — K219 Gastro-esophageal reflux disease without esophagitis: Secondary | ICD-10-CM

## 2017-12-21 DIAGNOSIS — G4730 Sleep apnea, unspecified: Secondary | ICD-10-CM

## 2017-12-21 DIAGNOSIS — D869 Sarcoidosis, unspecified: Secondary | ICD-10-CM | POA: Diagnosis not present

## 2017-12-21 DIAGNOSIS — J449 Chronic obstructive pulmonary disease, unspecified: Secondary | ICD-10-CM | POA: Diagnosis not present

## 2017-12-21 NOTE — Assessment & Plan Note (Signed)
Currently using oxygen 4 L/min on a pulsed system.  ContinueThis dosing

## 2017-12-21 NOTE — Assessment & Plan Note (Signed)
Continue Nexium as ordered, contributor to her cough.  Currently well controlled

## 2017-12-21 NOTE — Assessment & Plan Note (Signed)
Continue Flonase, loratadine 

## 2017-12-21 NOTE — Progress Notes (Signed)
Subjective:    Patient ID: Mackenzie Johnson, female    DOB: 05/09/1961, 57 y.o.   MRN: [id number]  HPI  ROV 06/10/17 --57 year old woman with sarcoidosis and associated COPD.  She also has sleep apnea on CPAP and chronic hypoxemic respiratory failure, allergic rhinitis. She just got her POC, is using at 4L/min pulsed. We changed her CPAP to auto-set, she hasn't started using it yet because due to her father's illness and dementia she has not been able to sleep in the bed, instead on the couch. She reports that her breathing has been doing fairly well. She did have a URI last week that is mostly resolved. She is on loratadine, flonase. Also on nexium. Flu shot is up to date. Last CT chest last spring. n  ROV 12/21/17 --Mackenzie Johnson is a 57 with a history of sarcoidosis and associated obstructive lung disease.  She also has sleep apnea for which she uses CPAP, allergic rhinitis, chronic hypoxemic respiratory failure.  She typically uses 4 L/min pulsed with a POC.  She was seen here 10/29/2017 with cough and fever, diagnosed with an acute Bronchitis.  She took Augmentin. Reports now that she is feeling better, back to baseline. She is doing a lot of work taking care of her elderly father. She is using spiriva. Has proair available and uses about once a month. We tried anoro at some point in the past - didn't benefit.  She remains on nexium, loratadine, flonase. Cough is well controlled.    Review of Systems  Constitutional: Negative for fever and unexpected weight change.  HENT: Positive for congestion. Negative for dental problem, ear pain, nosebleeds, postnasal drip, rhinorrhea, sinus pressure, sneezing, sore throat and trouble swallowing.   Eyes: Negative for redness and itching.  Respiratory: Positive for shortness of breath. Negative for cough, chest tightness and wheezing.   Cardiovascular: Negative for chest pain, palpitations and leg swelling.  Gastrointestinal: Negative for nausea and vomiting.   Genitourinary: Negative for dysuria.  Musculoskeletal: Negative for joint swelling.  Skin: Negative for rash.  Neurological: Negative for headaches.  Hematological: Does not bruise/bleed easily.  Psychiatric/Behavioral: Negative for dysphoric mood. The patient is not nervous/anxious.        Objective:   Physical Exam Vitals:   12/21/17 0854  BP: (!) 148/92  Pulse: 75  SpO2: 97%  Weight: 243 lb (110.2 kg)  Height: 4\' 10"  (1.473 m)   Gen: Pleasant, obese, in no distress,  normal affect  ENT: No lesions,  mouth clear,  oropharynx clear, no postnasal drip  Neck: No JVD, no stridor  Lungs: No use of accessory muscles, very distant but clear, no wheezing, no crackles  Cardiovascular: RRR, heart sounds normal, no murmur or gallops, no peripheral edema  Musculoskeletal: No deformities, no cyanosis or clubbing  Neuro: alert, non focal  Skin: Warm, no lesions or rashes   08/23/14 --   COMPARISON: Chest CT 11/13/2011.  FINDINGS: Mediastinum/Lymph Nodes: Heart size is borderline enlarged. There is no significant pericardial fluid, thickening or pericardial calcification. Marked dilatation of the pulmonic trunk (4.0 cm in diameter), suggesting pulmonary arterial hypertension. No pathologically enlarged mediastinal or hilar lymph nodes. Please note that accurate exclusion of hilar adenopathy is limited on noncontrast CT scans. Esophagus is unremarkable. Incidental note is made of an aberrant right subclavian artery (normal anatomical variant). No associated diverticulum of Kommerell. No axillary adenopathy.  Lungs/Pleura: High-resolution images demonstrate some peribronchovascular ground-glass attenuation, reticulation and architectural distortion in the lower lobes of the lungs bilaterally (  left much greater than right). In these same regions there is some mild cylindrical bronchiectasis. A few other patchy areas of very mild ground-glass attenuation and subtle  subpleural reticulation is noted, however, this is not a predominant finding, and appears randomly distributed. No honeycombing. Inspiratory and expiratory imaging is unremarkable.  Upper Abdomen: Unremarkable.  Musculoskeletal/Soft Tissues: There are no aggressive appearing lytic or blastic lesions noted in the visualized portions of the skeleton.  IMPRESSION: 1. There are no definite fibrotic changes in the lower lobes of the lungs bilaterally (left lower lobe to a much greater extent than the right). These findings are rather isolated, however, and are strongly favored to be areas of post infectious/inflammatory fibrosis. No other characteristic findings to suggest interstitial lung disease are noted at this time. 2. Marked dilatation of the pulmonic trunk, suggestive of pulmonary arterial hypertension. 3. Additional incidental findings, as above.      Assessment & Plan:  Allergic rhinitis Continue Flonase, loratadine  Sleep apnea, unspecified Continue CPAP every night  Obesity hypoventilation syndrome Currently using oxygen 4 L/min on a pulsed system.  ContinueThis dosing  COPD (chronic obstructive pulmonary disease) Improved from her recent bronchitis now back to baseline.  Continue her current Spiriva.  We have tried Anoro in the past but she did not benefit.  Uses albuterol rarely.  Functional capacity remains somewhat limited but minimal daytime acute symptoms like wheezing or cough.  GERD (gastroesophageal reflux disease) Continue Nexium as ordered, contributor to her cough.  Currently well controlled  Sarcoidosis Plan for chest x-ray at her next visit.  Levy Pupaobert Cayleigh Paull, MD, PhD 12/21/2017, 9:18 AM [facility] [phone] or if no answer [phone]

## 2017-12-21 NOTE — Assessment & Plan Note (Signed)
Plan for chest x-ray at her next visit.

## 2017-12-21 NOTE — Assessment & Plan Note (Signed)
Improved from her recent bronchitis now back to baseline.  Continue her current Spiriva.  We have tried Anoro in the past but she did not benefit.  Uses albuterol rarely.  Functional capacity remains somewhat limited but minimal daytime acute symptoms like wheezing or cough.

## 2017-12-21 NOTE — Assessment & Plan Note (Signed)
Continue CPAP every night. 

## 2017-12-21 NOTE — Patient Instructions (Signed)
Please continue your medications as you have been taking them Continue your CPAP and oxygen as you have been using them  Follow with Dr Delton CoombesByrum in 6 months or sooner if you have any problems

## 2018-01-26 ENCOUNTER — Other Ambulatory Visit: Payer: Self-pay | Admitting: Emergency Medicine

## 2018-02-23 ENCOUNTER — Other Ambulatory Visit: Payer: Self-pay | Admitting: Emergency Medicine

## 2018-04-04 DIAGNOSIS — R0609 Other forms of dyspnea: Secondary | ICD-10-CM

## 2018-04-04 DIAGNOSIS — R0600 Dyspnea, unspecified: Secondary | ICD-10-CM

## 2018-04-04 DIAGNOSIS — IMO0001 Reserved for inherently not codable concepts without codable children: Secondary | ICD-10-CM

## 2018-04-04 HISTORY — DX: Dyspnea, unspecified: R06.00

## 2018-04-04 HISTORY — DX: Reserved for inherently not codable concepts without codable children: IMO0001

## 2018-04-04 HISTORY — DX: Other forms of dyspnea: R06.09

## 2018-06-14 ENCOUNTER — Ambulatory Visit: Payer: Medicare Other | Admitting: Emergency Medicine

## 2018-06-16 ENCOUNTER — Encounter: Payer: Self-pay | Admitting: Emergency Medicine

## 2018-06-16 ENCOUNTER — Ambulatory Visit: Payer: Medicare Other | Admitting: Emergency Medicine

## 2018-06-16 DIAGNOSIS — K219 Gastro-esophageal reflux disease without esophagitis: Secondary | ICD-10-CM | POA: Diagnosis not present

## 2018-06-16 DIAGNOSIS — J301 Allergic rhinitis due to pollen: Secondary | ICD-10-CM

## 2018-06-16 DIAGNOSIS — J449 Chronic obstructive pulmonary disease, unspecified: Secondary | ICD-10-CM

## 2018-06-16 DIAGNOSIS — G4730 Sleep apnea, unspecified: Secondary | ICD-10-CM

## 2018-06-16 DIAGNOSIS — D869 Sarcoidosis, unspecified: Secondary | ICD-10-CM | POA: Diagnosis not present

## 2018-06-16 DIAGNOSIS — E662 Morbid (severe) obesity with alveolar hypoventilation: Secondary | ICD-10-CM

## 2018-06-16 NOTE — Assessment & Plan Note (Signed)
Continue current therapy 

## 2018-06-16 NOTE — Progress Notes (Signed)
Subjective:    Patient ID: Mackenzie Jupiteramela T Johnson, female    DOB: September 22, 1960, 57 y.o.   MRN: [id number]  HPI  ROV 06/10/17 --57 year old woman with sarcoidosis and associated COPD.  She also has sleep apnea on CPAP and chronic hypoxemic respiratory failure, allergic rhinitis. She just got her POC, is using at 4L/min pulsed. We changed her CPAP to auto-set, she hasn't started using it yet because due to her father's illness and dementia she has not been able to sleep in the bed, instead on the couch. She reports that her breathing has been doing fairly well. She did have a URI last week that is mostly resolved. She is on loratadine, flonase. Also on nexium. Flu shot is up to date. Last CT chest last spring. n  ROV 12/21/17 --Mackenzie Johnson is a [age] with a history of sarcoidosis and associated obstructive lung disease.  She also has sleep apnea for which she uses CPAP, allergic rhinitis, chronic hypoxemic respiratory failure.  She typically uses 4 L/min pulsed with a POC.  She was seen here 10/29/2017 with cough and fever, diagnosed with an acute Bronchitis.  She took Augmentin. Reports now that she is feeling better, back to baseline. She is doing a lot of work taking care of her elderly father. She is using spiriva. Has proair available and uses about once a month. We tried anoro at some point in the past - didn't benefit.  She remains on nexium, loratadine, flonase. Cough is well controlled.   ROV 06/16/18 --this is a follow-up visit for 57 year old patient with sarcoidosis and associated obstructive lung disease.  She also has sleep apnea and is on CPAP, chronic hypoxemic respiratory failure, allergic rhinitis.  Currently managed on Spiriva, did not seem to benefit with the addition of LABA (Anoro). Her O2 is at 3-4L/min. No flares since last time. Uses albuterol occasionally, a few times a week. Minimal cough, no real changes in her breathing. ? Rash in mid-chest. She notes that she has had to sleep on the couch some  nights, is unable to use her CPAP at those times. Flu shot up to date   Review of Systems  Constitutional: Negative for fever and unexpected weight change.  HENT: Positive for congestion. Negative for dental problem, ear pain, nosebleeds, postnasal drip, rhinorrhea, sinus pressure, sneezing, sore throat and trouble swallowing.   Eyes: Negative for redness and itching.  Respiratory: Positive for shortness of breath. Negative for cough, chest tightness and wheezing.   Cardiovascular: Negative for chest pain, palpitations and leg swelling.  Gastrointestinal: Negative for nausea and vomiting.  Genitourinary: Negative for dysuria.  Musculoskeletal: Negative for joint swelling.  Skin: Negative for rash.  Neurological: Negative for headaches.  Hematological: Does not bruise/bleed easily.  Psychiatric/Behavioral: Negative for dysphoric mood. The patient is not nervous/anxious.        Objective:   Physical Exam Vitals:   06/16/18 1417  BP: (!) 150/80  Pulse: 74  SpO2: 95%  Weight: 251 lb (113.9 kg)  Height: 4\' 10"  (1.473 m)   Gen: Pleasant, obese, in no distress,  normal affect  ENT: No lesions,  mouth clear,  oropharynx clear, no postnasal drip  Neck: No JVD, no stridor  Lungs: No use of accessory muscles, very distant but clear, no wheezing, no crackles  Cardiovascular: RRR, heart sounds normal, no murmur or gallops, no peripheral edema  Musculoskeletal: No deformities, no cyanosis or clubbing  Neuro: alert, non focal  Skin: Warm, no lesions or rashes   08/23/14 --  COMPARISON: Chest CT 11/13/2011.  FINDINGS: Mediastinum/Lymph Nodes: Heart size is borderline enlarged. There is no significant pericardial fluid, thickening or pericardial calcification. Marked dilatation of the pulmonic trunk (4.0 cm in diameter), suggesting pulmonary arterial hypertension. No pathologically enlarged mediastinal or hilar lymph nodes. Please note that accurate exclusion of hilar  adenopathy is limited on noncontrast CT scans. Esophagus is unremarkable. Incidental note is made of an aberrant right subclavian artery (normal anatomical variant). No associated diverticulum of Kommerell. No axillary adenopathy.  Lungs/Pleura: High-resolution images demonstrate some peribronchovascular ground-glass attenuation, reticulation and architectural distortion in the lower lobes of the lungs bilaterally (left much greater than right). In these same regions there is some mild cylindrical bronchiectasis. A few other patchy areas of very mild ground-glass attenuation and subtle subpleural reticulation is noted, however, this is not a predominant finding, and appears randomly distributed. No honeycombing. Inspiratory and expiratory imaging is unremarkable.  Upper Abdomen: Unremarkable.  Musculoskeletal/Soft Tissues: There are no aggressive appearing lytic or blastic lesions noted in the visualized portions of the skeleton.  IMPRESSION: 1. There are no definite fibrotic changes in the lower lobes of the lungs bilaterally (left lower lobe to a much greater extent than the right). These findings are rather isolated, however, and are strongly favored to be areas of post infectious/inflammatory fibrosis. No other characteristic findings to suggest interstitial lung disease are noted at this time. 2. Marked dilatation of the pulmonic trunk, suggestive of pulmonary arterial hypertension. 3. Additional incidental findings, as above.      Assessment & Plan:  Sarcoidosis Following clinically and with serial chest x-ray.  I do not have a chest x-ray capability available here today but we will do it next time.  No evidence of active sarcoid on her chest, no apparent raised rash.  GERD (gastroesophageal reflux disease) Continue current therapy  Allergic rhinitis Continue current therapy  Sleep apnea, unspecified She is compliant with her CPAP when she is able to sleep in  her bed.  Sometimes she has to sleep on the couch because of where her husband is sleeping.  On those occasions she does not use CPAP.  I have encouraged her to try to use it as much as possible.  She tolerates well.  She does get clinical benefit when she is able to use it, tells me that she uses it about 4 out of 7 nights a week  COPD (chronic obstructive pulmonary disease) Continue Spiriva as ordered, albuterol as needed. Flu shot up-to-date, pneumonia shot up-to-date  Obesity hypoventilation syndrome Continue her oxygen at 3 to 4 L/min at all times  Levy Pupaobert Smith Potenza, MD, PhD 06/16/2018, 2:57 PM [facility] [phone] or if no answer [phone]

## 2018-06-16 NOTE — Assessment & Plan Note (Signed)
Continue her oxygen at 3 to 4 L/min at all times

## 2018-06-16 NOTE — Assessment & Plan Note (Signed)
Following clinically and with serial chest x-ray.  I do not have a chest x-ray capability available here today but we will do it next time.  No evidence of active sarcoid on her chest, no apparent raised rash.

## 2018-06-16 NOTE — Assessment & Plan Note (Addendum)
Continue Spiriva as ordered, albuterol as needed. Flu shot up-to-date, pneumonia shot up-to-date

## 2018-06-16 NOTE — Assessment & Plan Note (Signed)
She is compliant with her CPAP when she is able to sleep in her bed.  Sometimes she has to sleep on the couch because of where her husband is sleeping.  On those occasions she does not use CPAP.  I have encouraged her to try to use it as much as possible.  She tolerates well.  She does get clinical benefit when she is able to use it, tells me that she uses it about 4 out of 7 nights a week

## 2018-06-16 NOTE — Patient Instructions (Signed)
Please continue current medications as you have been taking them. Please try to use your CPAP every night while sleeping. Continue your oxygen at 3 to 4 L/min at all times. Flu shot and pneumonia shot are up-to-date. We will perform a chest x-ray at your next office visit. Follow with Dr Delton CoombesByrum in 6 months or sooner if you have any problems

## 2018-08-16 ENCOUNTER — Other Ambulatory Visit: Payer: Self-pay | Admitting: Emergency Medicine

## 2018-08-16 MED ORDER — TIOTROPIUM BROMIDE MONOHYDRATE 18 MCG IN CAPS: ORAL_CAPSULE | RESPIRATORY_TRACT | 5 refills | Status: DC

## 2018-08-16 NOTE — Telephone Encounter (Signed)
Pt called in requesting a refill of Spiriva rx. This has been sent to preferred pharmacy. Pt verbalized understanding and denied any further questions or concerns at this time.

## 2018-09-06 ENCOUNTER — Other Ambulatory Visit: Payer: Self-pay | Admitting: Emergency Medicine

## 2018-09-07 ENCOUNTER — Other Ambulatory Visit: Payer: Self-pay | Admitting: Emergency Medicine

## 2018-10-03 ENCOUNTER — Other Ambulatory Visit: Payer: Self-pay | Admitting: Emergency Medicine

## 2018-12-29 ENCOUNTER — Other Ambulatory Visit: Payer: Self-pay | Admitting: Emergency Medicine

## 2019-01-10 ENCOUNTER — Other Ambulatory Visit: Payer: Self-pay

## 2019-01-10 ENCOUNTER — Ambulatory Visit (INDEPENDENT_AMBULATORY_CARE_PROVIDER_SITE_OTHER): Payer: Medicare Other

## 2019-01-10 ENCOUNTER — Encounter: Payer: Self-pay | Admitting: Emergency Medicine

## 2019-01-10 ENCOUNTER — Ambulatory Visit: Payer: Medicare Other | Admitting: Emergency Medicine

## 2019-01-10 VITALS — BP 126/76 | HR 98 | Ht <= 58 in | Wt 250.0 lb

## 2019-01-10 DIAGNOSIS — D869 Sarcoidosis, unspecified: Secondary | ICD-10-CM

## 2019-01-10 DIAGNOSIS — G4730 Sleep apnea, unspecified: Secondary | ICD-10-CM

## 2019-01-10 DIAGNOSIS — J9611 Chronic respiratory failure with hypoxia: Secondary | ICD-10-CM

## 2019-01-10 DIAGNOSIS — J449 Chronic obstructive pulmonary disease, unspecified: Secondary | ICD-10-CM

## 2019-01-10 NOTE — Assessment & Plan Note (Signed)
Continue to wear your CPAP reliably at night.  Try to start using it every night if possible.

## 2019-01-10 NOTE — Progress Notes (Signed)
Subjective:    Patient ID: Mackenzie Johnson, female    DOB: [birth year], 58 y.o.   MRN: [id number]  HPI  ROV 06/10/17 --58 year old woman with sarcoidosis and associated COPD.  She also has sleep apnea on CPAP and chronic hypoxemic respiratory failure, allergic rhinitis. She just got her POC, is using at 4L/min pulsed. We changed her CPAP to auto-set, she hasn't started using it yet because due to her father's illness and dementia she has not been able to sleep in the bed, instead on the couch. She reports that her breathing has been doing fairly well. She did have a URI last week that is mostly resolved. She is on loratadine, flonase. Also on nexium. Flu shot is up to date. Last CT chest last spring. n  ROV 12/21/17 --Mackenzie Johnson is a [age] with a history of sarcoidosis and associated obstructive lung disease.  She also has sleep apnea for which she uses CPAP, allergic rhinitis, chronic hypoxemic respiratory failure.  She typically uses 4 L/min pulsed with a POC.  She was seen here 10/29/2017 with cough and fever, diagnosed with an acute Bronchitis.  She took Augmentin. Reports now that she is feeling better, back to baseline. She is doing a lot of work taking care of her elderly father. She is using spiriva. Has proair available and uses about once a month. We tried anoro at some point in the past - didn't benefit.  She remains on nexium, loratadine, flonase. Cough is well controlled.   ROV 06/16/18 --this is a follow-up visit for 58 year old patient with sarcoidosis and associated obstructive lung disease.  She also has sleep apnea and is on CPAP, chronic hypoxemic respiratory failure, allergic rhinitis.  Currently managed on Spiriva, did not seem to benefit with the addition of LABA (Anoro). Her O2 is at 3-4L/min. No flares since last time. Uses albuterol occasionally, a few times a week. Minimal cough, no real changes in her breathing. ? Rash in mid-chest. She notes that she has had to sleep on the couch some  nights, is unable to use her CPAP at those times. Flu shot up to date  ROV 01/10/2019 --[name] is [age] and has a history of sarcoidosis with associated COPD.  She is also followed for obstructive sleep apnea (on CPAP), allergic rhinitis, chronic hypoxemic respiratory failure on oxygen at 3 to 4 L/min.  We managed her obstructive lung disease with Spiriva, she did not seem to get any additional benefit on Anoro.  Her last chest imaging was May 2019, last CT was May 2018. She reports that her breathing and activity levels are stable. Since last time she has had a diuretic added to her regimen. She is wearing her CPAP intermittently - uses it about 4 nights a week, because sometimes she has to sleep on the couch. Reliably takes Spiriva, uses albuterol 1-2x a day. Pneumovax up to date. Due for CXR vs CT chest. Denies any rash, LAD.    Review of Systems  Constitutional: Negative for fever and unexpected weight change.  HENT: Positive for congestion. Negative for dental problem, ear pain, nosebleeds, postnasal drip, rhinorrhea, sinus pressure, sneezing, sore throat and trouble swallowing.   Eyes: Negative for redness and itching.  Respiratory: Positive for shortness of breath. Negative for cough, chest tightness and wheezing.   Cardiovascular: Negative for chest pain, palpitations and leg swelling.  Gastrointestinal: Negative for nausea and vomiting.  Genitourinary: Negative for dysuria.  Musculoskeletal: Negative for joint swelling.  Skin: Negative for rash.  Neurological: Negative for headaches.  Hematological: Does not bruise/bleed easily.  Psychiatric/Behavioral: Negative for dysphoric mood. The patient is not nervous/anxious.        Objective:   Physical Exam Vitals:   01/10/19 1001  BP: 126/76  Pulse: 98  SpO2: 96%  Weight: 250 lb (113.4 kg)  Height: 4\' 9"  (1.448 m)   Gen: Pleasant, obese, in no distress,  normal affect  ENT: No lesions,  mouth clear,  oropharynx clear, no postnasal  drip  Neck: No JVD, no stridor  Lungs: No use of accessory muscles, very distant but clear, no wheezing, no crackles  Cardiovascular: RRR, heart sounds normal, no murmur or gallops, 1+ peripheral edema L > R  Musculoskeletal: No deformities, no cyanosis or clubbing  Neuro: alert, non focal  Skin: Warm, no lesions or rashes      Assessment & Plan:  COPD (chronic obstructive pulmonary disease) Please continue Spiriva every day as you have been taking it. Keep your albuterol available use 2 puffs if needed for shortness of breath, chest tightness, wheezing. Your pneumonia shot is up-to-date. Get the flu shot in the fall. Follow with Dr Lamonte Sakai in 6 months or sooner if you have any proble  Sleep apnea, unspecified Continue to wear your CPAP reliably at night.  Try to start using it every night if possible.   Chronic respiratory failure with hypoxia (HCC) Continue your oxygen at 3-4 L/min at all times.  Sarcoidosis CXR today. Will consider repeat CT chest in coming months depending on CXR and clinical status. No evidence sarcoid flare   Baltazar Apo, MD, PhD 01/10/2019, 10:13 AM [facility] [phone] or if no answer [phone]

## 2019-01-10 NOTE — Assessment & Plan Note (Signed)
Please continue Spiriva every day as you have been taking it. Keep your albuterol available use 2 puffs if needed for shortness of breath, chest tightness, wheezing. Your pneumonia shot is up-to-date. Get the flu shot in the fall. Follow with Dr Lamonte Sakai in 6 months or sooner if you have any proble

## 2019-01-10 NOTE — Assessment & Plan Note (Signed)
Continue your oxygen at 3-4 L/min at all times.

## 2019-01-10 NOTE — Assessment & Plan Note (Signed)
CXR today. Will consider repeat CT chest in coming months depending on CXR and clinical status. No evidence sarcoid flare

## 2019-01-10 NOTE — Patient Instructions (Addendum)
Chest x-ray today to follow your sarcoidosis. Please continue Spiriva every day as you have been taking it. Keep your albuterol available use 2 puffs if needed for shortness of breath, chest tightness, wheezing. Continue your oxygen at 3-4 L/min at all times. Continue to wear your CPAP reliably at night.  Try to start using it every night if possible. Your pneumonia shot is up-to-date. Get the flu shot in the fall. Follow with Dr Lamonte Sakai in 6 months or sooner if you have any problems

## 2019-01-31 ENCOUNTER — Other Ambulatory Visit: Payer: Self-pay | Admitting: Emergency Medicine

## 2019-06-06 ENCOUNTER — Telehealth: Payer: Self-pay | Admitting: Emergency Medicine

## 2019-06-06 NOTE — Telephone Encounter (Signed)
I called pt X 2 and the line was busy. I will try again later.  I spoke with Ashly and he states the pt is wanting them to pick up the oxygen. I was calling her to see what was going on.

## 2019-06-06 NOTE — Telephone Encounter (Signed)
Called patient. Each call would ring twice and then the busy tone would play. Will attempt again later.

## 2019-06-07 NOTE — Telephone Encounter (Signed)
ATC, line rings x 2 and then fast busy tone

## 2019-06-08 NOTE — Telephone Encounter (Signed)
ATC, line rings x 2 and then fast busy tone 

## 2019-06-09 NOTE — Telephone Encounter (Signed)
ATC, line rang busy twice.   Will send letter to patient and close this encounter.

## 2019-06-21 ENCOUNTER — Telehealth: Payer: Self-pay | Admitting: Emergency Medicine

## 2019-06-21 NOTE — Telephone Encounter (Signed)
I called and spoke with the patient and made her aware that we had reached out to her several times in regards to [employer] contacting us saying that they could not pick up her oxygen. She states that when she got all moved she called them and they came and picked it up. Nothing further is needed.

## 2019-06-27 ENCOUNTER — Encounter: Payer: Self-pay | Admitting: Emergency Medicine

## 2019-06-27 ENCOUNTER — Ambulatory Visit: Payer: Medicare Other | Admitting: Emergency Medicine

## 2019-06-27 ENCOUNTER — Telehealth: Payer: Self-pay | Admitting: Emergency Medicine

## 2019-06-27 ENCOUNTER — Other Ambulatory Visit: Payer: Self-pay

## 2019-06-27 VITALS — BP 140/80 | HR 83

## 2019-06-27 DIAGNOSIS — G4730 Sleep apnea, unspecified: Secondary | ICD-10-CM | POA: Diagnosis not present

## 2019-06-27 DIAGNOSIS — J9611 Chronic respiratory failure with hypoxia: Secondary | ICD-10-CM

## 2019-06-27 DIAGNOSIS — D869 Sarcoidosis, unspecified: Secondary | ICD-10-CM | POA: Diagnosis not present

## 2019-06-27 DIAGNOSIS — J449 Chronic obstructive pulmonary disease, unspecified: Secondary | ICD-10-CM

## 2019-06-27 NOTE — Assessment & Plan Note (Signed)
Stable CXR 12/2018. Will plan timing of repeat CT chest depending on clinical change, discuss next time.

## 2019-06-27 NOTE — Assessment & Plan Note (Signed)
She had to give up her oxygen due to the size constraints of the room that she is renting.  She gave back her home concentrator, also her portable concentrator to [employer].  She needs to be on submental oxygen so we will reorder the portable concentrator and let her use this as her primary source both at home and while out of the home.

## 2019-06-27 NOTE — Telephone Encounter (Signed)
Spoke with Estill Bamberg and she states she cannot provide a POC for the patient as stated in your OV note from today. She must start with the regular tanks first. She states [employer] may be the only DME company that can provide a simply Go Mini but they are not able to do this. RB please advise.   Stable CXR 12/2018. Will plan timing of repeat CT chest depending on clinical change, discuss next time.     Patient Instructions by Collene Gobble, MD at 06/27/2019 9:45 AM Author: Collene Gobble, MD Author Type: Physician Filed: 06/27/2019 10:24 AM  Note Status: Addendum Cosign: Cosign Not Required Encounter Date: 06/27/2019  Editor: Collene Gobble, MD (Physician)  Prior Versions: 1. Collene Gobble, MD (Physician) at 06/27/2019 10:23 AM - Signed    We will work with [employer] to get you back on your supplemental oxygen.  You should be able to use a portable oxygen concentrator both in the home and when exerting outside the home. Please continue Spiriva once a day as you have been taking it. Keep your albuterol available to use 2 puffs if needed for shortness of breath, chest tightness, wheezing. You need to restart your CPAP and use it reliably every night Flu shot up-to-date, pneumonia shot up-to-date Follow with Dr Lamonte Sakai in 2 months or sooner if you have any problems.

## 2019-06-27 NOTE — Assessment & Plan Note (Signed)
Overall stable.  Most important thing is to get her back on her submental oxygen.  Continue her Spiriva, albuterol as needed.  Flu shot pneumonia shot both up-to-date.  She will be a good candidate for COVID-19 vaccination when it becomes available.

## 2019-06-27 NOTE — Patient Instructions (Addendum)
We will work with [employer] to get you back on your supplemental oxygen.  You should be able to use a portable oxygen concentrator both in the home and when exerting outside the home. Please continue Spiriva once a day as you have been taking it. Keep your albuterol available to use 2 puffs if needed for shortness of breath, chest tightness, wheezing. You need to restart your CPAP and use it reliably every night Flu shot up-to-date, pneumonia shot up-to-date Follow with Dr Lamonte Sakai in 2 months or sooner if you have any problems.

## 2019-06-27 NOTE — Progress Notes (Signed)
Subjective:    Patient ID: Mackenzie Johnson, female    DOB: August 02, 1960, 58 y.o.   MRN: [id number]  HPI   ROV 01/10/2019 --Mackenzie Johnson is 58 and has a history of sarcoidosis with associated COPD.  She is also followed for obstructive sleep apnea (on CPAP), allergic rhinitis, chronic hypoxemic respiratory failure on oxygen at 3 to 4 L/min.  We managed her obstructive lung disease with Spiriva, she did not seem to get any additional benefit on Anoro.  Her last chest imaging was May 2019, last CT was May 2018. She reports that her breathing and activity levels are stable. Since last time she has had a diuretic added to her regimen. She is wearing her CPAP intermittently - uses it about 4 nights a week, because sometimes she has to sleep on the couch. Reliably takes Spiriva, uses albuterol 1-2x a day. Pneumovax up to date. Due for CXR vs CT chest. Denies any rash, LAD.   ROV 06/27/2019 --this follow-up visit for 58 year old woman with sarcoidosis and associated obstructive lung disease.  She also has OSA on CPAP, chronic hypoxemic respiratory failure.  She uses oxygen at 3 to 4 L/min.  She has stable chest x-ray 01/10/2019 with subtle fibrotic change.  Currently managed on Spiriva, also uses fluticasone nasal spray, loratadine, Nexium.  She has albuterol which she uses 1-2x a week. She no longer has a nebulizer - it broke.  2 months ago she had to move after her father died. She is living now with neighbors in a small room >> she didn't have room for her equipment, so she turned in her O2 to [facility]. She has CPAP, but has not been using since the move. She is having HA, increased dyspnea.    Review of Systems  Constitutional: Negative for fever and unexpected weight change.  HENT: Positive for congestion. Negative for dental problem, ear pain, nosebleeds, postnasal drip, rhinorrhea, sinus pressure, sneezing, sore throat and trouble swallowing.   Eyes: Negative for redness and itching.  Respiratory: Positive  for shortness of breath. Negative for cough, chest tightness and wheezing.   Cardiovascular: Negative for chest pain, palpitations and leg swelling.  Gastrointestinal: Negative for nausea and vomiting.  Genitourinary: Negative for dysuria.  Musculoskeletal: Negative for joint swelling.  Skin: Negative for rash.  Neurological: Negative for headaches.  Hematological: Does not bruise/bleed easily.  Psychiatric/Behavioral: Negative for dysphoric mood. The patient is not nervous/anxious.        Objective:   Physical Exam Vitals:   06/27/19 0953  BP: 140/80  Pulse: 83  SpO2: 96%   Gen: Pleasant, obese, in no distress,  normal affect  ENT: No lesions,  mouth clear,  oropharynx clear, no postnasal drip  Neck: No JVD, no stridor  Lungs: No use of accessory muscles, very distant but clear, no wheezing, no crackles  Cardiovascular: RRR, heart sounds normal, no murmur or gallops, trace peripheral edema L > R  Musculoskeletal: No deformities, no cyanosis or clubbing  Neuro: alert, non focal  Skin: Warm, no lesions or rashes      Assessment & Plan:  Sarcoidosis Stable CXR 12/2018. Will plan timing of repeat CT chest depending on clinical change, discuss next time.   Chronic respiratory failure with hypoxia (HCC) She had to give up her oxygen due to the size constraints of the room that she is renting.  She gave back her home concentrator, also her portable concentrator to [employer].  She needs to be on submental oxygen so we will  reorder the portable concentrator and let her use this as her primary source both at home and while out of the home.  Sleep apnea, unspecified She has not been using her CPAP reliably since she moved out of her home.  I encouraged her to restart.  She is concerned that there is none of room but I believe that she should be able to arrange for this try to help her make a plan to do so.  COPD (chronic obstructive pulmonary disease) Overall stable.  Most  important thing is to get her back on her submental oxygen.  Continue her Spiriva, albuterol as needed.  Flu shot pneumonia shot both up-to-date.  She will be a good candidate for COVID-19 vaccination when it becomes available.  Baltazar Apo, MD, PhD 06/27/2019, 10:27 AM [facility] [phone] or if no answer [phone]

## 2019-06-27 NOTE — Assessment & Plan Note (Signed)
She has not been using her CPAP reliably since she moved out of her home.  I encouraged her to restart.  She is concerned that there is none of room but I believe that she should be able to arrange for this try to help her make a plan to do so.

## 2019-06-27 NOTE — Telephone Encounter (Signed)
Please ask the patient if she would be willing to change DME to [facility]. If so then I say we get the POC through them

## 2019-06-27 NOTE — Telephone Encounter (Signed)
Left message for patient to call back  

## 2019-06-29 NOTE — Telephone Encounter (Signed)
ATC pt, there was no answer and I could not leave a message. Will try back. 

## 2019-07-03 NOTE — Telephone Encounter (Signed)
Yes

## 2019-07-03 NOTE — Telephone Encounter (Signed)
Order was pended by Irving Burton and signed under TP's name as authorized below.  Patient is aware the order has been signed and she should expect to hear from [employer] within a week.  Nothing further needed; will sign off.

## 2019-07-03 NOTE — Telephone Encounter (Signed)
Called and spoke with pt stating to her the info we received from [facility] and also stated to her the info from RB asking if she would be okay if we changed her DME back to [facility] to see if they could provide her for a POC. Pt said that would be fine to switch DME back to [facility]. I have placed a new order for pt with DME as [facility].  Due to Dr. Delton Coombes working in the hospital, Tammy, please advise if you are okay signing order so we can get all taken care of for pt.

## 2019-07-04 ENCOUNTER — Telehealth: Payer: Self-pay | Admitting: Adult Health

## 2019-07-04 DIAGNOSIS — J9611 Chronic respiratory failure with hypoxia: Secondary | ICD-10-CM

## 2019-07-04 NOTE — Telephone Encounter (Signed)
Called and spoke to [place] with [employer] and was advised that pt will need a qualifying walk since she is changing from [facility] to [facility]. Called and spoke to pt. Qualifying walk has been scheduled for 07/06/2019 at 1000. Will keep message open to inform [employer] after walk has been completed to get pt her POC.

## 2019-07-06 ENCOUNTER — Ambulatory Visit: Payer: Medicare Other

## 2019-07-06 ENCOUNTER — Other Ambulatory Visit: Payer: Self-pay

## 2019-07-06 DIAGNOSIS — J440 Chronic obstructive pulmonary disease with acute lower respiratory infection: Secondary | ICD-10-CM

## 2019-07-10 NOTE — Telephone Encounter (Signed)
Called and spoke to [place] with [employer]. He states he will relay this information to [place] and she will call back.

## 2019-07-11 NOTE — Telephone Encounter (Signed)
Melissa w/[facility] calling in regards to POC.  Please advise.  [phone].

## 2019-07-11 NOTE — Telephone Encounter (Signed)
Spoke with Melissa ([facility]) and she stated that because of the high demand for POC's due to covid, they do not have any in stock right now. The oxygen needs have increased dramatically and they are trying to keep up with the demand.   Melissa stated they would love to be able to provide her with a POC (referral was placed by our office 07/06/19), but they are not able to do so at this time. They can provide for her oxygen needs, but cannot provide POC at this time.  The other alternative would be the patient keeping [facility] and if they can provide a POC if they have one available.   Tried to call the patient on both of her contact numbers to make her aware of this and automated message says the call cannot be completed, to check the number and try again (which was done twice).  Message kept in triage for follow up later today. Below is cut/paste from referral dated 07/03/19 :  "Patient was on a home concentrator system, but had to suddenly move d/t a death in the family. When pt moved, the place she moved to was too small to accommodate housing her concentrator system. When patient called former DME [facility] to have this concentrator system removed, her entire system was taken from her (concentrator system and POC). Pt wears 4lpm, needs to have her POC returned to her as she is currently on room air."

## 2019-07-12 NOTE — Telephone Encounter (Signed)
Called spoke with patient. I let her know there were no POC.  Patient states the concentrators are too big for where she lives. She states she is using her CPAP at night.  She is unsure if she needs oxygen .   I will place order for home O2 not a concentrator and see what [facility] can do for her.

## 2019-07-17 ENCOUNTER — Telehealth: Payer: Self-pay | Admitting: Emergency Medicine

## 2019-07-17 NOTE — Telephone Encounter (Signed)
[facility] patient, no answer and no voicemail °

## 2019-07-17 NOTE — Telephone Encounter (Signed)
Spoke with pt, she states she is unable to carry the portable tanks she has now because they are too heavy. I advised her that the POCs are on back order at [facility]. She also stated that her oxygen concentrator at home worked for 30 mins and then stopped. I called [facility] to ask them to go out to the patients house to service the concentrator. They placed a ticket and the CSR stated they would get someone to come out there to service.   Can we send an order to another DME company to get a POC? RB please advise. Order pended.

## 2019-07-19 NOTE — Telephone Encounter (Signed)
[facility] patient at both numbers listed - both numbers went to busy signal.  WCB.

## 2019-07-19 NOTE — Telephone Encounter (Signed)
This is Ok with me as long as the patient agrees with working with another company. She may want to change everything over to a new company ? - it sounds like she may need a new home concentrator anyway. I will support either plan - working with 2 companies, or consolidating her O2 care with a single new company.

## 2019-07-20 NOTE — Telephone Encounter (Signed)
Spoke with pt, she states she received another home concentrator and at this time she will wait for the POC. She doesn't have the money to buy one right now. I advised her to call us when she needed the order sent. Pt understood and nothing further is needed.

## 2019-08-18 ENCOUNTER — Ambulatory Visit: Payer: Medicare Other | Admitting: Emergency Medicine

## 2019-09-14 DIAGNOSIS — J449 Chronic obstructive pulmonary disease, unspecified: Secondary | ICD-10-CM | POA: Diagnosis not present

## 2019-09-14 DIAGNOSIS — J9611 Chronic respiratory failure with hypoxia: Secondary | ICD-10-CM | POA: Diagnosis not present

## 2019-09-14 DIAGNOSIS — D869 Sarcoidosis, unspecified: Secondary | ICD-10-CM | POA: Diagnosis not present

## 2019-09-14 DIAGNOSIS — G4730 Sleep apnea, unspecified: Secondary | ICD-10-CM | POA: Diagnosis not present

## 2019-09-15 ENCOUNTER — Encounter: Payer: Self-pay | Admitting: Emergency Medicine

## 2019-09-15 ENCOUNTER — Ambulatory Visit (INDEPENDENT_AMBULATORY_CARE_PROVIDER_SITE_OTHER): Payer: Medicare Other | Admitting: Emergency Medicine

## 2019-09-15 ENCOUNTER — Other Ambulatory Visit: Payer: Self-pay

## 2019-09-15 DIAGNOSIS — J449 Chronic obstructive pulmonary disease, unspecified: Secondary | ICD-10-CM

## 2019-09-15 DIAGNOSIS — D869 Sarcoidosis, unspecified: Secondary | ICD-10-CM | POA: Diagnosis not present

## 2019-09-15 DIAGNOSIS — J9611 Chronic respiratory failure with hypoxia: Secondary | ICD-10-CM | POA: Diagnosis not present

## 2019-09-15 DIAGNOSIS — G4730 Sleep apnea, unspecified: Secondary | ICD-10-CM

## 2019-09-15 NOTE — Assessment & Plan Note (Signed)
No real clinical indication currently to repeat CT scan of the chest.  Chest x-ray last time was stable.  All the same she has not had a scan since 2018 and will be reasonable to plan to repeat one this year.  We can discuss the timing next visit.  No evidence of a sarcoid flare otherwise.

## 2019-09-15 NOTE — Assessment & Plan Note (Signed)
Clearly under using her oxygen.  The reasons appear to be more access then to her willingness to comply.  A normal sized concentrator will not fit in the room that she rents.  She needs a portable oxygen concentrator we will continue to work try to get this for her.  Currently she is using large tanks and is under using.

## 2019-09-15 NOTE — Patient Instructions (Signed)
We will continue to work on getting you a portable oxygen concentrator so that you can get back to wearing your oxygen reliably. You need to work on trying to wear your CPAP every night Please continue Spiriva once daily as you have been taking it. Keep your albuterol available to use 2 puffs if needed for shortness of breath, chest tightness, wheezing. We will probably plan to repeat your CT scan of the chest later this year.  We can talk about the timing at your next office visit. Follow with Dr Delton Coombes in 3 months or sooner if you have any problems.

## 2019-09-15 NOTE — Assessment & Plan Note (Signed)
Continue Spiriva as ordered, albuterol as needed.  No flares.  She needs the COVID-19 vaccine and she is working on arrangements to get this.

## 2019-09-15 NOTE — Progress Notes (Signed)
Subjective:    Patient ID: Mackenzie Johnson, female    DOB: Feb 25, 1961, 59 y.o.   MRN: [id number]  HPI  ROV 01/10/2019 --Mackenzie Johnson is 21 and has a history of sarcoidosis with associated COPD.  Mackenzie Johnson is also followed for obstructive sleep apnea (on CPAP), allergic rhinitis, chronic hypoxemic respiratory failure on oxygen at 3 to 4 L/min.  We managed Mackenzie Johnson obstructive lung disease with Spiriva, Mackenzie Johnson did not seem to get any additional benefit on Anoro.  Mackenzie Johnson last chest imaging was May 2019, last CT was May 2018. Mackenzie Johnson reports that Mackenzie Johnson breathing and activity levels are stable. Since last time Mackenzie Johnson has had a diuretic added to Mackenzie Johnson regimen. Mackenzie Johnson is wearing Mackenzie Johnson CPAP intermittently - uses it about 4 nights a week, because sometimes Mackenzie Johnson has to sleep on the couch. Reliably takes Spiriva, uses albuterol 1-2x a day. Pneumovax up to date. Due for CXR vs CT chest. Denies any rash, LAD.   ROV 06/27/2019 --this follow-up visit for 59 year old woman with sarcoidosis and associated obstructive lung disease.  Mackenzie Johnson also has OSA on CPAP, chronic hypoxemic respiratory failure.  Mackenzie Johnson uses oxygen at 3 to 4 L/min.  Mackenzie Johnson has stable chest x-ray 01/10/2019 with subtle fibrotic change.  Currently managed on Spiriva, also uses fluticasone nasal spray, loratadine, Nexium.  Mackenzie Johnson has albuterol which Mackenzie Johnson uses 1-2x a week. Mackenzie Johnson no longer has a nebulizer - it broke.  2 months ago Mackenzie Johnson had to move after Mackenzie Johnson father died. Mackenzie Johnson is living now with neighbors in a small room >> Mackenzie Johnson didn't have room for Mackenzie Johnson equipment, so Mackenzie Johnson turned in Mackenzie Johnson O2 to [facility]. Mackenzie Johnson has CPAP, but has not been using since the move. Mackenzie Johnson is having HA, increased dyspnea.   ROV 09/15/19 --58 year old woman with obesity, sarcoidosis with associated obstructive lung disease, OSA on CPAP, chronic hypoxemic respiratory failure for which Mackenzie Johnson uses oxygen at 3 to 4 L/min.  At Mackenzie Johnson last visit Mackenzie Johnson was having difficulty using Mackenzie Johnson CPAP and also had to change Mackenzie Johnson oxygen delivery system due to the room that Mackenzie Johnson  was renting and the size of the device.  Today Mackenzie Johnson reports that Mackenzie Johnson is using tanks, doesn't have a concentrator. Mackenzie Johnson DME is [facility]. Mackenzie Johnson compliance w Mackenzie Johnson O2 is low - use prn. Mackenzie Johnson has not been using Mackenzie Johnson CPAP, still difficult because no place to put it. It is a full face mask. Having some nocturnal awakenings. Mackenzie Johnson remains on Spiriva, has stable exertional SOB. Uses Mackenzie Johnson albuterol rarely. No cough, wheeze. No rashes.    Review of Systems  Constitutional: Negative for fever and unexpected weight change.  HENT: Positive for congestion. Negative for dental problem, ear pain, nosebleeds, postnasal drip, rhinorrhea, sinus pressure, sneezing, sore throat and trouble swallowing.   Eyes: Negative for redness and itching.  Respiratory: Positive for shortness of breath. Negative for cough, chest tightness and wheezing.   Cardiovascular: Negative for chest pain, palpitations and leg swelling.  Gastrointestinal: Negative for nausea and vomiting.  Genitourinary: Negative for dysuria.  Musculoskeletal: Negative for joint swelling.  Skin: Negative for rash.  Neurological: Negative for headaches.  Hematological: Does not bruise/bleed easily.  Psychiatric/Behavioral: Negative for dysphoric mood. The patient is not nervous/anxious.        Objective:   Physical Exam Vitals:   09/15/19 1007  BP: 138/72  Pulse: 69  Temp: (!) 97.3 F (36.3 C)  TempSrc: Temporal  SpO2: 96%  Weight: 207 lb 9.6 oz (94.2 kg)  Height: 4\' 10"  (1.473 m)  Gen: Pleasant, obese, in no distress,  normal affect  ENT: No lesions,  mouth clear,  oropharynx clear, no postnasal drip  Neck: No JVD, no stridor  Lungs: No use of accessory muscles, very distant but clear, no wheezing, no crackles  Cardiovascular: RRR, heart sounds normal, no murmur or gallops, no edema   Musculoskeletal: No deformities, no cyanosis or clubbing  Neuro: alert, non focal  Skin: Warm, no lesions or rashes      Assessment & Plan:  Sarcoidosis No  real clinical indication currently to repeat CT scan of the chest.  Chest x-ray last time was stable.  All the same Mackenzie Johnson has not had a scan since 2018 and will be reasonable to plan to repeat one this year.  We can discuss the timing next visit.  No evidence of a sarcoid flare otherwise.  Chronic respiratory failure with hypoxia (HCC) Clearly under using Mackenzie Johnson oxygen.  The reasons appear to be more access then to Mackenzie Johnson willingness to comply.  A normal sized concentrator will not fit in the room that Mackenzie Johnson rents.  Mackenzie Johnson needs a portable oxygen concentrator we will continue to work try to get this for Mackenzie Johnson.  Currently Mackenzie Johnson is using large tanks and is under using.  Sleep apnea, unspecified Poor compliance with Mackenzie Johnson CPAP.  I discussed better compliance with Mackenzie Johnson today.  Again a barrier is the size of Mackenzie Johnson room and difficulty managing device.  I think that Mackenzie Johnson should be able to overcome this and have asked Mackenzie Johnson to try.  COPD (chronic obstructive pulmonary disease) Continue Spiriva as ordered, albuterol as needed.  No flares.  Mackenzie Johnson needs the COVID-19 vaccine and Mackenzie Johnson is working on arrangements to get this.  Baltazar Apo, MD, PhD 09/15/2019, 10:30 AM [facility] [phone] or if no answer [phone]

## 2019-09-15 NOTE — Assessment & Plan Note (Signed)
Poor compliance with her CPAP.  I discussed better compliance with her today.  Again a barrier is the size of her room and difficulty managing device.  I think that she should be able to overcome this and have asked her to try.

## 2019-10-15 DIAGNOSIS — J449 Chronic obstructive pulmonary disease, unspecified: Secondary | ICD-10-CM | POA: Diagnosis not present

## 2019-10-15 DIAGNOSIS — D869 Sarcoidosis, unspecified: Secondary | ICD-10-CM | POA: Diagnosis not present

## 2019-10-15 DIAGNOSIS — G4730 Sleep apnea, unspecified: Secondary | ICD-10-CM | POA: Diagnosis not present

## 2019-10-15 DIAGNOSIS — J9611 Chronic respiratory failure with hypoxia: Secondary | ICD-10-CM | POA: Diagnosis not present

## 2019-11-14 DIAGNOSIS — J449 Chronic obstructive pulmonary disease, unspecified: Secondary | ICD-10-CM | POA: Diagnosis not present

## 2019-11-14 DIAGNOSIS — J9611 Chronic respiratory failure with hypoxia: Secondary | ICD-10-CM | POA: Diagnosis not present

## 2019-11-14 DIAGNOSIS — G4730 Sleep apnea, unspecified: Secondary | ICD-10-CM | POA: Diagnosis not present

## 2019-11-14 DIAGNOSIS — D869 Sarcoidosis, unspecified: Secondary | ICD-10-CM | POA: Diagnosis not present

## 2019-11-15 ENCOUNTER — Telehealth: Payer: Self-pay | Admitting: Emergency Medicine

## 2019-11-15 MED ORDER — SPIRIVA HANDIHALER 18 MCG IN CAPS: ORAL_CAPSULE | RESPIRATORY_TRACT | 11 refills | Status: DC

## 2019-11-15 MED ORDER — ALBUTEROL SULFATE (2.5 MG/3ML) 0.083% IN NEBU: INHALATION_SOLUTION | RESPIRATORY_TRACT | 5 refills | Status: DC

## 2019-11-15 MED ORDER — ALBUTEROL SULFATE HFA 108 (90 BASE) MCG/ACT IN AERS: INHALATION_SPRAY | RESPIRATORY_TRACT | 5 refills | Status: DC

## 2019-11-15 NOTE — Telephone Encounter (Signed)
Spoke with pt and advised rx sent to pharmacy. Nothing further is needed.     to repeat one this year.  We can discuss the timing next visit.  No evidence of a sarcoid flare otherwise.    Patient Instructions by Leslye Peer, MD at 09/15/2019 10:00 AM Author: Leslye Peer, MD Author Type: Physician Filed: 09/15/2019 10:27 AM  Note Status: Signed Cosign: Cosign Not Required Encounter Date: 09/15/2019  Editor: Leslye Peer, MD (Physician)    We will continue to work on getting you a portable oxygen concentrator so that you can get back to wearing your oxygen reliably. You need to work on trying to wear your CPAP every night Please continue Spiriva once daily as you have been taking it. Keep your albuterol available to use 2 puffs if needed for shortness of breath, chest tightness, wheezing. We will probably plan to repeat your CT scan of the chest later this year.  We can talk about the timing at your next office visit. Follow with Dr Delton Coombes in 3 months or sooner if you have any problems.

## 2019-12-06 ENCOUNTER — Other Ambulatory Visit: Payer: Self-pay

## 2019-12-06 ENCOUNTER — Ambulatory Visit: Payer: Medicare Other | Admitting: Emergency Medicine

## 2019-12-06 ENCOUNTER — Ambulatory Visit (INDEPENDENT_AMBULATORY_CARE_PROVIDER_SITE_OTHER): Payer: Medicare Other

## 2019-12-06 ENCOUNTER — Encounter: Payer: Self-pay | Admitting: Emergency Medicine

## 2019-12-06 DIAGNOSIS — D869 Sarcoidosis, unspecified: Secondary | ICD-10-CM

## 2019-12-06 DIAGNOSIS — J9611 Chronic respiratory failure with hypoxia: Secondary | ICD-10-CM | POA: Diagnosis not present

## 2019-12-06 DIAGNOSIS — J449 Chronic obstructive pulmonary disease, unspecified: Secondary | ICD-10-CM | POA: Diagnosis not present

## 2019-12-06 DIAGNOSIS — G4730 Sleep apnea, unspecified: Secondary | ICD-10-CM

## 2019-12-06 NOTE — Assessment & Plan Note (Signed)
Continue same inhaler regimen.  COVID-19 vaccine up-to-date

## 2019-12-06 NOTE — Progress Notes (Signed)
Subjective:    Patient ID: Mackenzie Johnson, female    DOB: [birth year], 59 y.o.   MRN: [id number]  HPI  ROV 09/15/19 --59 year old woman with obesity, sarcoidosis with associated obstructive lung disease, OSA on CPAP, chronic hypoxemic respiratory failure for which she uses oxygen at 3 to 4 L/min.  At her last visit she was having difficulty using her CPAP and also had to change her oxygen delivery system due to the room that she was renting and the size of the device.  Today she reports that she is using tanks, doesn't have a concentrator. Her DME is [facility]. Her compliance w her O2 is low - use prn. She has not been using her CPAP, still difficult because no place to put it. It is a full face mask. Having some nocturnal awakenings. She remains on Spiriva, has stable exertional SOB. Uses her albuterol rarely. No cough, wheeze. No rashes.   ROV 12/06/19 --follow-up visit for 59 year old woman with history of obesity and sarcoidosis, associated obstructive lung disease, OSA on CPAP.  She has chronic hypoxemic respiratory failure and needs oxygen at 3 to 4 L/min.  She is also been having difficulty getting a portable oxygen concentrator.  The large tanks are too heavy for her to carry so her oxygen compliance is poor. She wants a POC. She was working with [facility]. I believe they were offering her smaller tanks - but this hasn't been done. She is currently on Spiriva. She is wearing her CPAP more reliably - about 3-4 nights a week. She is planning to move in about a month - possibly out of state. Last CT chest was 3 yrs ago.    Review of Systems  Constitutional: Negative for fever and unexpected weight change.  HENT: Positive for congestion. Negative for dental problem, ear pain, nosebleeds, postnasal drip, rhinorrhea, sinus pressure, sneezing, sore throat and trouble swallowing.   Eyes: Negative for redness and itching.  Respiratory: Positive for shortness of breath. Negative for cough, chest tightness and  wheezing.   Cardiovascular: Negative for chest pain, palpitations and leg swelling.  Gastrointestinal: Negative for nausea and vomiting.  Genitourinary: Negative for dysuria.  Musculoskeletal: Negative for joint swelling.  Skin: Negative for rash.  Neurological: Negative for headaches.  Hematological: Does not bruise/bleed easily.  Psychiatric/Behavioral: Negative for dysphoric mood. The patient is not nervous/anxious.        Objective:   Physical Exam Vitals:   12/06/19 1027  BP: 116/72  Pulse: 74  Temp: 98.9 F (37.2 C)  TempSrc: Oral  SpO2: 94%  Weight: 201 lb 9.6 oz (91.4 kg)  Height: 4\' 9"  (1.448 m)   Gen: Pleasant, obese, in no distress,  normal affect  ENT: No lesions,  mouth clear,  oropharynx clear, no postnasal drip  Neck: No JVD, no stridor  Lungs: No use of accessory muscles, very distant but clear, no wheezing, no crackles  Cardiovascular: RRR, heart sounds normal, no murmur or gallops, no edema   Musculoskeletal: No deformities, no cyanosis or clubbing  Neuro: alert, non focal  Skin: Warm, no lesions or rashes      Assessment & Plan:  Chronic respiratory failure with hypoxia (HCC) Getting her oxygen compliance improved is been complicated.  She wants a POC, has used this in the past.  She has large tanks at home concentrator.  She needs 3 to 4 L/min with exertion.  We have worked with [facility].  I believe they offered her smaller tanks with a regulator, have not offered  her a POC.  She did not take the tanks, unclear to me that she understood that they were a potential substitute for the POC.  Her oxygen compliance is poor.  We need to continue to work to get her oxygen that she can carry.  I offered to change DME or work with her current DME now she tells me that she is going to move, and is not sure where she is going to live, possibly out of state.  She wants to defer any new arrangement with DME until she knows where she is going to be located.  We can talk  about it next time.  COPD (chronic obstructive pulmonary disease) Continue same inhaler regimen.  COVID-19 vaccine up-to-date  Sleep apnea, unspecified Moderate compliance with her CPAP.  It is improved.  She is using 3-4 nights per week.  I will continue to encourage her to use it more frequently.  Sarcoidosis She needs a surveillance chest x-ray today.  Her last CT scan of the chest was in 2018.  Baltazar Apo, MD, PhD 12/06/2019, 11:01 AM [facility] [phone] or if no answer [phone]

## 2019-12-06 NOTE — Patient Instructions (Addendum)
Please continue your oxygen at 3-4L/min with exertion. When we determine your future living situation we will pursue changing DME companies and continue to work on getting you a portable oxygen concentrator.  Please continue your Spiriva daily Keep albuterol available to use 2 puffs up to every 4 hours if needed for shortness of breath, chest tightness, wheezing.  We will repeat your CXR today to compare with priors.  COVID vaccine up to date.  Follow with Dr Delton Coombes in 3 months or sooner if you have any problems.

## 2019-12-06 NOTE — Addendum Note (Signed)
Addended by: Dorisann Frames R on: 12/06/2019 11:15 AM   Modules accepted: Orders

## 2019-12-06 NOTE — Assessment & Plan Note (Signed)
She needs a surveillance chest x-ray today.  Her last CT scan of the chest was in 2018.

## 2019-12-06 NOTE — Assessment & Plan Note (Signed)
Getting her oxygen compliance improved is been complicated.  She wants a POC, has used this in the past.  She has large tanks at home concentrator.  She needs 3 to 4 L/min with exertion.  We have worked with [facility].  I believe they offered her smaller tanks with a regulator, have not offered her a POC.  She did not take the tanks, unclear to me that she understood that they were a potential substitute for the POC.  Her oxygen compliance is poor.  We need to continue to work to get her oxygen that she can carry.  I offered to change DME or work with her current DME now she tells me that she is going to move, and is not sure where she is going to live, possibly out of state.  She wants to defer any new arrangement with DME until she knows where she is going to be located.  We can talk about it next time.

## 2019-12-06 NOTE — Assessment & Plan Note (Signed)
Moderate compliance with her CPAP.  It is improved.  She is using 3-4 nights per week.  I will continue to encourage her to use it more frequently.

## 2019-12-11 DIAGNOSIS — K5900 Constipation, unspecified: Secondary | ICD-10-CM | POA: Diagnosis not present

## 2019-12-11 DIAGNOSIS — E119 Type 2 diabetes mellitus without complications: Secondary | ICD-10-CM | POA: Diagnosis not present

## 2019-12-11 DIAGNOSIS — E785 Hyperlipidemia, unspecified: Secondary | ICD-10-CM | POA: Diagnosis not present

## 2019-12-11 DIAGNOSIS — D860 Sarcoidosis of lung: Secondary | ICD-10-CM | POA: Diagnosis not present

## 2019-12-11 DIAGNOSIS — N183 Chronic kidney disease, stage 3 unspecified: Secondary | ICD-10-CM | POA: Diagnosis not present

## 2019-12-11 DIAGNOSIS — K219 Gastro-esophageal reflux disease without esophagitis: Secondary | ICD-10-CM | POA: Diagnosis not present

## 2019-12-15 DIAGNOSIS — D869 Sarcoidosis, unspecified: Secondary | ICD-10-CM | POA: Diagnosis not present

## 2019-12-15 DIAGNOSIS — J9611 Chronic respiratory failure with hypoxia: Secondary | ICD-10-CM | POA: Diagnosis not present

## 2019-12-15 DIAGNOSIS — G4730 Sleep apnea, unspecified: Secondary | ICD-10-CM | POA: Diagnosis not present

## 2019-12-15 DIAGNOSIS — J449 Chronic obstructive pulmonary disease, unspecified: Secondary | ICD-10-CM | POA: Diagnosis not present

## 2020-01-16 DIAGNOSIS — Z1231 Encounter for screening mammogram for malignant neoplasm of breast: Secondary | ICD-10-CM | POA: Diagnosis not present

## 2020-03-13 ENCOUNTER — Other Ambulatory Visit: Payer: Self-pay

## 2020-03-13 ENCOUNTER — Encounter: Payer: Self-pay | Admitting: Emergency Medicine

## 2020-03-13 ENCOUNTER — Ambulatory Visit: Payer: Medicare Other | Admitting: Emergency Medicine

## 2020-03-13 DIAGNOSIS — J9611 Chronic respiratory failure with hypoxia: Secondary | ICD-10-CM | POA: Diagnosis not present

## 2020-03-13 DIAGNOSIS — D869 Sarcoidosis, unspecified: Secondary | ICD-10-CM

## 2020-03-13 DIAGNOSIS — J449 Chronic obstructive pulmonary disease, unspecified: Secondary | ICD-10-CM

## 2020-03-13 DIAGNOSIS — G4730 Sleep apnea, unspecified: Secondary | ICD-10-CM

## 2020-03-13 NOTE — Assessment & Plan Note (Signed)
You need to wear your CPAP every night.  We will try to get CPAP supplies for you through [facility]

## 2020-03-13 NOTE — Assessment & Plan Note (Signed)
CXR stable in 11/2019. No new rash, LAD reported

## 2020-03-13 NOTE — Assessment & Plan Note (Signed)
Please continue Spiriva as you have been taking it. Keep your albuterol available use 1 nebulizer treatment or 2 puffs if needed for shortness of breath, chest tightness, wheezing. Flu and COVID-19 vaccines are both up-to-date Follow with Dr Delton Coombes in 6 months or sooner if you have any problems

## 2020-03-13 NOTE — Assessment & Plan Note (Signed)
She still doesn't have any O2. She would be willing to try to obtain a concentrator from [facility]. She tells me that she is going to try to get a POC on her own at some point. Walking oximetry today confirmed dasat and that she needs 3L/min with exertion.   Continue your loratadine 10 mg once daily. We will reestablish you with [facility] so that he can get back on supplemental oxygen.  You need to have this 3 L/min with exertion (documented today)

## 2020-03-13 NOTE — Progress Notes (Signed)
Subjective:    Patient ID: Mackenzie Johnson, female    DOB: 06-28-1961, 59 y.o.   MRN: [id number]  HPI  ROV 09/15/19 --59 year old woman with obesity, sarcoidosis with associated obstructive lung disease, OSA on CPAP, chronic hypoxemic respiratory failure for which she uses oxygen at 3 to 4 L/min.  At her last visit she was having difficulty using her CPAP and also had to change her oxygen delivery system due to the room that she was renting and the size of the device.  Today she reports that she is using tanks, doesn't have a concentrator. Her DME is [employer]. Her compliance w her O2 is low - use prn. She has not been using her CPAP, still difficult because no place to put it. It is a full face mask. Having some nocturnal awakenings. She remains on Spiriva, has stable exertional SOB. Uses her albuterol rarely. No cough, wheeze. No rashes.   ROV 12/06/19 --follow-up visit for 59 year old woman with history of obesity and sarcoidosis, associated obstructive lung disease, OSA on CPAP.  She has chronic hypoxemic respiratory failure and needs oxygen at 3 to 4 L/min.  She is also been having difficulty getting a portable oxygen concentrator.  The large tanks are too heavy for her to carry so her oxygen compliance is poor. She wants a POC. She was working with [employer]. I believe they were offering her smaller tanks - but this hasn't been done. She is currently on Spiriva. She is wearing her CPAP more reliably - about 3-4 nights a week. She is planning to move in about a month - possibly out of state. Last CT chest was 3 yrs ago.   ROV 03/13/20 --59 year old woman who follows up today for history of obesity, sarcoidosis with associated obstructive lung disease, OSA on CPAP, chronic hypoxic respiratory failure.  She has had a lot of barriers to getting her oxygen due to difficulty carrying heavy tanks, difficulty accommodating a concentrator in her living quarters.  She was considering moving out of state but has now  decided to stay local.  Chest x-ray from June reviewed.  No evidence of sequela from sarcoid except for some mild chronic left basilar scar which was stable.  Currently managed on Spiriva, has albuterol which she uses approximately uses 2-3x a day.  Also on loratadine, ran out of fluticasone nasal spray.  She is in a new location [place], does not yet have a concentrator. Company has been [employer], but she wants to change to [employer]. She is worried that the wiring in her home is 110 and would be a problem. I told her that most devices are 110 and that we can confirm with company. She wears her CPAP 3-4 nights a week.  Flu shot and COVID shots are up to date.    Review of Systems  Constitutional: Negative for fever and unexpected weight change.  HENT: Positive for congestion. Negative for dental problem, ear pain, nosebleeds, postnasal drip, rhinorrhea, sinus pressure, sneezing, sore throat and trouble swallowing.   Eyes: Negative for redness and itching.  Respiratory: Positive for shortness of breath. Negative for cough, chest tightness and wheezing.   Cardiovascular: Negative for chest pain, palpitations and leg swelling.  Gastrointestinal: Negative for nausea and vomiting.  Genitourinary: Negative for dysuria.  Musculoskeletal: Negative for joint swelling.  Skin: Negative for rash.  Neurological: Negative for headaches.  Hematological: Does not bruise/bleed easily.  Psychiatric/Behavioral: Negative for dysphoric mood. The patient is not nervous/anxious.  Objective:   Physical Exam Vitals:   03/13/20 1044  BP: 122/70  Pulse: 69  Temp: 97.6 F (36.4 C)  TempSrc: Temporal  SpO2: 95%  Weight: 191 lb 12.8 oz (87 kg)  Height: 4\' 9"  (1.448 m)   Gen: Pleasant, obese, in no distress,  normal affect  ENT: No lesions,  mouth clear,  oropharynx clear, no postnasal drip  Neck: No JVD, no stridor  Lungs: No use of accessory muscles, very distant but clear, no wheezing, no  crackles  Cardiovascular: RRR, heart sounds normal, no murmur or gallops, no edema   Musculoskeletal: No deformities, no cyanosis or clubbing  Neuro: alert, non focal  Skin: Warm, no lesions or rashes      Assessment & Plan:  COPD (chronic obstructive pulmonary disease) Please continue Spiriva as you have been taking it. Keep your albuterol available use 1 nebulizer treatment or 2 puffs if needed for shortness of breath, chest tightness, wheezing. Flu and COVID-19 vaccines are both up-to-date Follow with Dr in 6 months or sooner if you have any problems  Chronic respiratory failure with hypoxia [facility]) She still doesn't have any O2. She would be willing to try to obtain a concentrator from [place]. She tells me that she is going to try to get a POC on her own at some point. Walking oximetry today confirmed dasat and that she needs 3L/min with exertion.   Continue your loratadine 10 mg once daily. We will reestablish you with [facility] so that he can get back on supplemental oxygen.  You need to have this 3 L/min with exertion (documented today)   Sleep apnea, unspecified You need to wear your CPAP every night.  We will try to get CPAP supplies for you through [employer]  Sarcoidosis CXR stable in 11/2019. No new rash, LAD reported  12/2019, MD, PhD 03/13/2020, 11:40 AM [facility] [phone] or if no answer [phone]

## 2020-03-13 NOTE — Patient Instructions (Signed)
Please continue Spiriva as you have been taking it. Keep your albuterol available use 1 nebulizer treatment or 2 puffs if needed for shortness of breath, chest tightness, wheezing. Continue your loratadine 10 mg once daily. We will reestablish you with [employer] so that he can get back on supplemental oxygen.  You need to have this 3 L/min with exertion (documented today) You need to wear your CPAP every night.  We will try to get CPAP supplies for you through [employer] Flu and COVID-19 vaccines are both up-to-date Follow with Dr Delton Coombes in 6 months or sooner if you have any problems

## 2020-03-13 NOTE — Addendum Note (Signed)
Addended by: Christen Butter on: 03/13/2020 03:52 PM   Modules accepted: Orders

## 2020-03-15 DIAGNOSIS — G4733 Obstructive sleep apnea (adult) (pediatric): Secondary | ICD-10-CM | POA: Diagnosis not present

## 2020-03-15 DIAGNOSIS — R0689 Other abnormalities of breathing: Secondary | ICD-10-CM | POA: Diagnosis not present

## 2020-03-25 ENCOUNTER — Telehealth: Payer: Self-pay | Admitting: Emergency Medicine

## 2020-03-26 NOTE — Telephone Encounter (Signed)
Attempted to call pt but unable to reach and unable to leave VM due to mailbox not set up yet. Will try to call back later.

## 2020-03-27 NOTE — Telephone Encounter (Signed)
Called and spoke with pt in regards to call from [facility] and she stated that she did cancel order for cpap supplies at this time. Stated to pt to call us when she wanted to have this sent back to [facility] and she verbalized understanding. Nothing further needed.

## 2020-04-11 DIAGNOSIS — E119 Type 2 diabetes mellitus without complications: Secondary | ICD-10-CM | POA: Diagnosis not present

## 2020-04-11 DIAGNOSIS — K219 Gastro-esophageal reflux disease without esophagitis: Secondary | ICD-10-CM | POA: Diagnosis not present

## 2020-04-11 DIAGNOSIS — E785 Hyperlipidemia, unspecified: Secondary | ICD-10-CM | POA: Diagnosis not present

## 2020-04-11 DIAGNOSIS — N183 Chronic kidney disease, stage 3 unspecified: Secondary | ICD-10-CM | POA: Diagnosis not present

## 2020-04-11 DIAGNOSIS — D860 Sarcoidosis of lung: Secondary | ICD-10-CM | POA: Diagnosis not present

## 2020-04-11 DIAGNOSIS — N390 Urinary tract infection, site not specified: Secondary | ICD-10-CM | POA: Diagnosis not present

## 2020-04-14 DIAGNOSIS — J9611 Chronic respiratory failure with hypoxia: Secondary | ICD-10-CM | POA: Diagnosis not present

## 2020-04-14 DIAGNOSIS — R0689 Other abnormalities of breathing: Secondary | ICD-10-CM | POA: Diagnosis not present

## 2020-04-14 DIAGNOSIS — G4733 Obstructive sleep apnea (adult) (pediatric): Secondary | ICD-10-CM | POA: Diagnosis not present

## 2020-04-28 ENCOUNTER — Other Ambulatory Visit: Payer: Self-pay | Admitting: Emergency Medicine

## 2020-05-07 DIAGNOSIS — H04123 Dry eye syndrome of bilateral lacrimal glands: Secondary | ICD-10-CM | POA: Diagnosis not present

## 2020-05-07 DIAGNOSIS — H25813 Combined forms of age-related cataract, bilateral: Secondary | ICD-10-CM | POA: Diagnosis not present

## 2020-05-07 DIAGNOSIS — E119 Type 2 diabetes mellitus without complications: Secondary | ICD-10-CM | POA: Diagnosis not present

## 2020-05-07 DIAGNOSIS — H40013 Open angle with borderline findings, low risk, bilateral: Secondary | ICD-10-CM | POA: Diagnosis not present

## 2020-05-15 DIAGNOSIS — J9611 Chronic respiratory failure with hypoxia: Secondary | ICD-10-CM | POA: Diagnosis not present

## 2020-05-15 DIAGNOSIS — R0689 Other abnormalities of breathing: Secondary | ICD-10-CM | POA: Diagnosis not present

## 2020-05-15 DIAGNOSIS — G4733 Obstructive sleep apnea (adult) (pediatric): Secondary | ICD-10-CM | POA: Diagnosis not present

## 2020-06-14 DIAGNOSIS — J9611 Chronic respiratory failure with hypoxia: Secondary | ICD-10-CM | POA: Diagnosis not present

## 2020-06-14 DIAGNOSIS — G4733 Obstructive sleep apnea (adult) (pediatric): Secondary | ICD-10-CM | POA: Diagnosis not present

## 2020-06-14 DIAGNOSIS — R0689 Other abnormalities of breathing: Secondary | ICD-10-CM | POA: Diagnosis not present

## 2020-07-15 DIAGNOSIS — G4733 Obstructive sleep apnea (adult) (pediatric): Secondary | ICD-10-CM | POA: Diagnosis not present

## 2020-07-15 DIAGNOSIS — J9611 Chronic respiratory failure with hypoxia: Secondary | ICD-10-CM | POA: Diagnosis not present

## 2020-07-15 DIAGNOSIS — R0689 Other abnormalities of breathing: Secondary | ICD-10-CM | POA: Diagnosis not present

## 2020-08-14 DIAGNOSIS — E1122 Type 2 diabetes mellitus with diabetic chronic kidney disease: Secondary | ICD-10-CM | POA: Diagnosis not present

## 2020-08-14 DIAGNOSIS — K5900 Constipation, unspecified: Secondary | ICD-10-CM | POA: Diagnosis not present

## 2020-08-14 DIAGNOSIS — E785 Hyperlipidemia, unspecified: Secondary | ICD-10-CM | POA: Diagnosis not present

## 2020-08-14 DIAGNOSIS — E119 Type 2 diabetes mellitus without complications: Secondary | ICD-10-CM | POA: Diagnosis not present

## 2020-08-14 DIAGNOSIS — N183 Chronic kidney disease, stage 3 unspecified: Secondary | ICD-10-CM | POA: Diagnosis not present

## 2020-08-14 DIAGNOSIS — K219 Gastro-esophageal reflux disease without esophagitis: Secondary | ICD-10-CM | POA: Diagnosis not present

## 2020-08-14 DIAGNOSIS — D860 Sarcoidosis of lung: Secondary | ICD-10-CM | POA: Diagnosis not present

## 2020-08-15 DIAGNOSIS — G4733 Obstructive sleep apnea (adult) (pediatric): Secondary | ICD-10-CM | POA: Diagnosis not present

## 2020-08-15 DIAGNOSIS — R0689 Other abnormalities of breathing: Secondary | ICD-10-CM | POA: Diagnosis not present

## 2020-08-15 DIAGNOSIS — J9611 Chronic respiratory failure with hypoxia: Secondary | ICD-10-CM | POA: Diagnosis not present

## 2020-09-12 DIAGNOSIS — G4733 Obstructive sleep apnea (adult) (pediatric): Secondary | ICD-10-CM | POA: Diagnosis not present

## 2020-09-12 DIAGNOSIS — R0689 Other abnormalities of breathing: Secondary | ICD-10-CM | POA: Diagnosis not present

## 2020-09-12 DIAGNOSIS — J9611 Chronic respiratory failure with hypoxia: Secondary | ICD-10-CM | POA: Diagnosis not present

## 2020-09-17 ENCOUNTER — Ambulatory Visit: Payer: Medicare Other | Admitting: Emergency Medicine

## 2020-09-17 ENCOUNTER — Encounter: Payer: Self-pay | Admitting: Emergency Medicine

## 2020-09-17 ENCOUNTER — Other Ambulatory Visit: Payer: Self-pay

## 2020-09-17 DIAGNOSIS — D869 Sarcoidosis, unspecified: Secondary | ICD-10-CM | POA: Diagnosis not present

## 2020-09-17 DIAGNOSIS — J301 Allergic rhinitis due to pollen: Secondary | ICD-10-CM

## 2020-09-17 DIAGNOSIS — J449 Chronic obstructive pulmonary disease, unspecified: Secondary | ICD-10-CM | POA: Diagnosis not present

## 2020-09-17 DIAGNOSIS — K219 Gastro-esophageal reflux disease without esophagitis: Secondary | ICD-10-CM

## 2020-09-17 DIAGNOSIS — J9611 Chronic respiratory failure with hypoxia: Secondary | ICD-10-CM | POA: Diagnosis not present

## 2020-09-17 NOTE — Assessment & Plan Note (Signed)
With mild subtle interstitial disease.  Chest x-ray has been stable.  Need to repeat her CT chest to compare with 2018.  We can do that this summer.  She would prefer to get it done at [place].

## 2020-09-17 NOTE — Progress Notes (Signed)
Subjective:    Patient ID: Mackenzie Johnson, female    DOB: [birth year], 60 y.o.   MRN: [id number]  HPI  ROV 03/13/20 --60 year old woman who follows up today for history of obesity, sarcoidosis with associated obstructive lung disease, OSA on CPAP, chronic hypoxic respiratory failure.  She has had a lot of barriers to getting her oxygen due to difficulty carrying heavy tanks, difficulty accommodating a concentrator in her living quarters.  She was considering moving out of state but has now decided to stay local.  Chest x-ray from June reviewed.  No evidence of sequela from sarcoid except for some mild chronic left basilar scar which was stable.  Currently managed on Spiriva, has albuterol which she uses approximately uses 2-3x a day.  Also on loratadine, ran out of fluticasone nasal spray.  She is in a new location [place], does not yet have a concentrator. Company has been [employer], but she wants to change to [employer]. She is worried that the wiring in her home is 110 and would be a problem. I told her that most devices are 110 and that we can confirm with company. She wears her CPAP 3-4 nights a week.  Flu shot and COVID shots are up to date.   ROV 09/17/20 --Mackenzie Johnson is 60, follows up today for multifactorial chronic hypoxemic respiratory failure.  This is in the setting of OSA/OHS, sarcoidosis with associated obstructive lung disease.  Her last high-res CT chest was done in 2018.  Chest x-rays have been reassuring and stable.  We have been having difficulty getting her oxygen order completed due to barriers in the home and with the DME companies.  She has qualified for oxygen and needs 3 L/min with exertion.  She returns today reporting that she now has a home concentrator and a POC, set on 2L/min. She has not seen any desats on the 2L/min. She is not wearing her CPAP machine reliably - she says the room is too small, and she can't use her O2 concentrator and the CPAP at the same time. Some limitation  with exertion, especially shopping - better with the O2.  Managed on Spiriva, uses albuterol approximately 1-2x a day, usually nebs. Rare HFA use.  Loratadine, nexium. She is off flonase. Coughs occasionally, not every day, minimally productive.     Review of Systems  Constitutional: Negative for fever and unexpected weight change.  HENT: Positive for congestion. Negative for dental problem, ear pain, nosebleeds, postnasal drip, rhinorrhea, sinus pressure, sneezing, sore throat and trouble swallowing.   Eyes: Negative for redness and itching.  Respiratory: Positive for shortness of breath. Negative for cough, chest tightness and wheezing.   Cardiovascular: Negative for chest pain, palpitations and leg swelling.  Gastrointestinal: Negative for nausea and vomiting.  Genitourinary: Negative for dysuria.  Musculoskeletal: Negative for joint swelling.  Skin: Negative for rash.  Neurological: Negative for headaches.  Hematological: Does not bruise/bleed easily.  Psychiatric/Behavioral: Negative for dysphoric mood. The patient is not nervous/anxious.        Objective:   Physical Exam Vitals:   09/17/20 1347  BP: 118/74  Pulse: 74  Temp: 98.5 F (36.9 C)  TempSrc: Temporal  SpO2: 96%  Weight: 204 lb 9.6 oz (92.8 kg)  Height: 4\' 9"  (1.448 m)   Gen: Pleasant, obese, in no distress,  normal affect  ENT: No lesions,  mouth clear,  oropharynx clear, no postnasal drip  Neck: No JVD, no stridor  Lungs: No use of accessory muscles, very distant but  clear, no wheezing, no crackles  Cardiovascular: RRR, heart sounds normal, no murmur or gallops, no edema   Musculoskeletal: No deformities, no cyanosis or clubbing  Neuro: alert, non focal  Skin: Warm, no lesions or rashes      Assessment & Plan:  COPD (chronic obstructive pulmonary disease) Please continue Spiriva once daily as you have been taking it. Keep albuterol available to use either 1 nebulizer treatment or 2 puffs up to  every 4 hours if needed for shortness of breath, chest tightness, wheezing. Follow Dr. Delton Coombes in June 2022   Chronic respiratory failure with hypoxia (HCC) Wear your oxygen at 2 L/min.  You can turn this up to 3 L/min when you are exerting yourself.  Allergic rhinitis Currently off flonase, seems to be tolerating  Continue loratadine once daily as you have been taking it  GERD (gastroesophageal reflux disease) Continue Nexium as you have been taking it  Sarcoidosis With mild subtle interstitial disease.  Chest x-ray has been stable.  Need to repeat her CT chest to compare with 2018.  We can do that this summer.  She would prefer to get it done at [place].  Levy Pupa, MD, PhD 09/17/2020, 2:01 PM [facility] [phone] or if no answer [phone]

## 2020-09-17 NOTE — Assessment & Plan Note (Signed)
Please continue Spiriva once daily as you have been taking it. Keep albuterol available to use either 1 nebulizer treatment or 2 puffs up to every 4 hours if needed for shortness of breath, chest tightness, wheezing. Follow Dr. Delton Coombes in June 2022

## 2020-09-17 NOTE — Patient Instructions (Signed)
Please continue Spiriva once daily as you have been taking it. Keep albuterol available to use either 1 nebulizer treatment or 2 puffs up to every 4 hours if needed for shortness of breath, chest tightness, wheezing. We will perform a CT scan of the chest without contrast at [facility] to follow your sarcoidosis Wear your oxygen at 2 L/min.  You can turn this up to 3 L/min when you are exerting yourself. You would benefit from getting back on your CPAP every night. Continue loratadine once daily as you have been taking it Continue Nexium as you have been taking it Follow Dr. Delton Coombes in June 2022 so that we can review your CT scan.

## 2020-09-17 NOTE — Assessment & Plan Note (Signed)
Continue Nexium as you have been taking it 

## 2020-09-17 NOTE — Assessment & Plan Note (Signed)
Currently off flonase, seems to be tolerating  Continue loratadine once daily as you have been taking it

## 2020-09-17 NOTE — Addendum Note (Signed)
Addended by: Dorisann Frames R on: 09/17/2020 02:05 PM   Modules accepted: Orders

## 2020-09-17 NOTE — Assessment & Plan Note (Signed)
Wear your oxygen at 2 L/min.  You can turn this up to 3 L/min when you are exerting yourself.

## 2020-10-13 DIAGNOSIS — G4733 Obstructive sleep apnea (adult) (pediatric): Secondary | ICD-10-CM | POA: Diagnosis not present

## 2020-10-13 DIAGNOSIS — R0689 Other abnormalities of breathing: Secondary | ICD-10-CM | POA: Diagnosis not present

## 2020-10-13 DIAGNOSIS — J9611 Chronic respiratory failure with hypoxia: Secondary | ICD-10-CM | POA: Diagnosis not present

## 2020-11-12 DIAGNOSIS — R0689 Other abnormalities of breathing: Secondary | ICD-10-CM | POA: Diagnosis not present

## 2020-11-12 DIAGNOSIS — G4733 Obstructive sleep apnea (adult) (pediatric): Secondary | ICD-10-CM | POA: Diagnosis not present

## 2020-11-12 DIAGNOSIS — J9611 Chronic respiratory failure with hypoxia: Secondary | ICD-10-CM | POA: Diagnosis not present

## 2020-11-26 ENCOUNTER — Other Ambulatory Visit: Payer: Self-pay | Admitting: Emergency Medicine

## 2020-12-03 DIAGNOSIS — M47819 Spondylosis without myelopathy or radiculopathy, site unspecified: Secondary | ICD-10-CM | POA: Diagnosis not present

## 2020-12-03 DIAGNOSIS — D860 Sarcoidosis of lung: Secondary | ICD-10-CM | POA: Diagnosis not present

## 2020-12-03 DIAGNOSIS — I2510 Atherosclerotic heart disease of native coronary artery without angina pectoris: Secondary | ICD-10-CM | POA: Diagnosis not present

## 2020-12-03 DIAGNOSIS — J479 Bronchiectasis, uncomplicated: Secondary | ICD-10-CM | POA: Diagnosis not present

## 2020-12-03 DIAGNOSIS — D869 Sarcoidosis, unspecified: Secondary | ICD-10-CM | POA: Diagnosis not present

## 2020-12-12 DIAGNOSIS — I152 Hypertension secondary to endocrine disorders: Secondary | ICD-10-CM | POA: Diagnosis not present

## 2020-12-12 DIAGNOSIS — E1159 Type 2 diabetes mellitus with other circulatory complications: Secondary | ICD-10-CM | POA: Diagnosis not present

## 2020-12-12 DIAGNOSIS — K219 Gastro-esophageal reflux disease without esophagitis: Secondary | ICD-10-CM | POA: Diagnosis not present

## 2020-12-12 DIAGNOSIS — E119 Type 2 diabetes mellitus without complications: Secondary | ICD-10-CM | POA: Diagnosis not present

## 2020-12-12 DIAGNOSIS — D860 Sarcoidosis of lung: Secondary | ICD-10-CM | POA: Diagnosis not present

## 2020-12-12 DIAGNOSIS — K5900 Constipation, unspecified: Secondary | ICD-10-CM | POA: Diagnosis not present

## 2020-12-12 DIAGNOSIS — E785 Hyperlipidemia, unspecified: Secondary | ICD-10-CM | POA: Diagnosis not present

## 2020-12-12 DIAGNOSIS — N183 Chronic kidney disease, stage 3 unspecified: Secondary | ICD-10-CM | POA: Diagnosis not present

## 2020-12-13 DIAGNOSIS — J9611 Chronic respiratory failure with hypoxia: Secondary | ICD-10-CM | POA: Diagnosis not present

## 2020-12-13 DIAGNOSIS — R0689 Other abnormalities of breathing: Secondary | ICD-10-CM | POA: Diagnosis not present

## 2020-12-13 DIAGNOSIS — G4733 Obstructive sleep apnea (adult) (pediatric): Secondary | ICD-10-CM | POA: Diagnosis not present

## 2020-12-23 ENCOUNTER — Ambulatory Visit: Payer: Medicare Other | Admitting: Emergency Medicine

## 2021-01-02 DIAGNOSIS — R195 Other fecal abnormalities: Secondary | ICD-10-CM | POA: Diagnosis not present

## 2021-01-12 DIAGNOSIS — J9611 Chronic respiratory failure with hypoxia: Secondary | ICD-10-CM | POA: Diagnosis not present

## 2021-01-12 DIAGNOSIS — G4733 Obstructive sleep apnea (adult) (pediatric): Secondary | ICD-10-CM | POA: Diagnosis not present

## 2021-01-12 DIAGNOSIS — R0689 Other abnormalities of breathing: Secondary | ICD-10-CM | POA: Diagnosis not present

## 2021-01-14 DIAGNOSIS — G2581 Restless legs syndrome: Secondary | ICD-10-CM | POA: Diagnosis not present

## 2021-01-14 DIAGNOSIS — M542 Cervicalgia: Secondary | ICD-10-CM | POA: Diagnosis not present

## 2021-01-14 DIAGNOSIS — I361 Nonrheumatic tricuspid (valve) insufficiency: Secondary | ICD-10-CM | POA: Diagnosis not present

## 2021-01-14 DIAGNOSIS — Z882 Allergy status to sulfonamides status: Secondary | ICD-10-CM | POA: Diagnosis not present

## 2021-01-14 DIAGNOSIS — R001 Bradycardia, unspecified: Secondary | ICD-10-CM | POA: Diagnosis not present

## 2021-01-14 DIAGNOSIS — N183 Chronic kidney disease, stage 3 unspecified: Secondary | ICD-10-CM | POA: Diagnosis not present

## 2021-01-14 DIAGNOSIS — G319 Degenerative disease of nervous system, unspecified: Secondary | ICD-10-CM | POA: Diagnosis not present

## 2021-01-14 DIAGNOSIS — I10 Essential (primary) hypertension: Secondary | ICD-10-CM | POA: Diagnosis not present

## 2021-01-14 DIAGNOSIS — I129 Hypertensive chronic kidney disease with stage 1 through stage 4 chronic kidney disease, or unspecified chronic kidney disease: Secondary | ICD-10-CM | POA: Diagnosis not present

## 2021-01-14 DIAGNOSIS — I6389 Other cerebral infarction: Secondary | ICD-10-CM | POA: Diagnosis not present

## 2021-01-14 DIAGNOSIS — G4733 Obstructive sleep apnea (adult) (pediatric): Secondary | ICD-10-CM | POA: Diagnosis not present

## 2021-01-14 DIAGNOSIS — R519 Headache, unspecified: Secondary | ICD-10-CM | POA: Diagnosis not present

## 2021-01-14 DIAGNOSIS — I6523 Occlusion and stenosis of bilateral carotid arteries: Secondary | ICD-10-CM | POA: Diagnosis not present

## 2021-01-14 DIAGNOSIS — M6281 Muscle weakness (generalized): Secondary | ICD-10-CM | POA: Diagnosis not present

## 2021-01-14 DIAGNOSIS — E1159 Type 2 diabetes mellitus with other circulatory complications: Secondary | ICD-10-CM | POA: Diagnosis not present

## 2021-01-14 DIAGNOSIS — G8929 Other chronic pain: Secondary | ICD-10-CM | POA: Diagnosis not present

## 2021-01-14 DIAGNOSIS — J449 Chronic obstructive pulmonary disease, unspecified: Secondary | ICD-10-CM | POA: Diagnosis not present

## 2021-01-14 DIAGNOSIS — E1169 Type 2 diabetes mellitus with other specified complication: Secondary | ICD-10-CM | POA: Diagnosis not present

## 2021-01-14 DIAGNOSIS — Z7982 Long term (current) use of aspirin: Secondary | ICD-10-CM | POA: Diagnosis not present

## 2021-01-14 DIAGNOSIS — D631 Anemia in chronic kidney disease: Secondary | ICD-10-CM | POA: Diagnosis not present

## 2021-01-14 DIAGNOSIS — E785 Hyperlipidemia, unspecified: Secondary | ICD-10-CM | POA: Diagnosis not present

## 2021-01-14 DIAGNOSIS — Z79899 Other long term (current) drug therapy: Secondary | ICD-10-CM | POA: Diagnosis not present

## 2021-01-14 DIAGNOSIS — I152 Hypertension secondary to endocrine disorders: Secondary | ICD-10-CM | POA: Diagnosis not present

## 2021-01-14 DIAGNOSIS — I2510 Atherosclerotic heart disease of native coronary artery without angina pectoris: Secondary | ICD-10-CM | POA: Diagnosis not present

## 2021-01-14 DIAGNOSIS — Z794 Long term (current) use of insulin: Secondary | ICD-10-CM | POA: Diagnosis not present

## 2021-01-14 DIAGNOSIS — I340 Nonrheumatic mitral (valve) insufficiency: Secondary | ICD-10-CM | POA: Diagnosis not present

## 2021-01-15 DIAGNOSIS — J449 Chronic obstructive pulmonary disease, unspecified: Secondary | ICD-10-CM

## 2021-01-15 DIAGNOSIS — I6389 Other cerebral infarction: Secondary | ICD-10-CM | POA: Diagnosis not present

## 2021-01-15 DIAGNOSIS — G4733 Obstructive sleep apnea (adult) (pediatric): Secondary | ICD-10-CM

## 2021-01-15 DIAGNOSIS — R001 Bradycardia, unspecified: Secondary | ICD-10-CM | POA: Diagnosis not present

## 2021-01-15 DIAGNOSIS — I2510 Atherosclerotic heart disease of native coronary artery without angina pectoris: Secondary | ICD-10-CM | POA: Diagnosis not present

## 2021-01-16 DIAGNOSIS — I6389 Other cerebral infarction: Secondary | ICD-10-CM | POA: Diagnosis not present

## 2021-01-16 DIAGNOSIS — I2510 Atherosclerotic heart disease of native coronary artery without angina pectoris: Secondary | ICD-10-CM | POA: Diagnosis not present

## 2021-01-16 DIAGNOSIS — G4733 Obstructive sleep apnea (adult) (pediatric): Secondary | ICD-10-CM | POA: Diagnosis not present

## 2021-01-16 DIAGNOSIS — R001 Bradycardia, unspecified: Secondary | ICD-10-CM | POA: Diagnosis not present

## 2021-01-17 DIAGNOSIS — R001 Bradycardia, unspecified: Secondary | ICD-10-CM | POA: Diagnosis not present

## 2021-01-17 DIAGNOSIS — I2510 Atherosclerotic heart disease of native coronary artery without angina pectoris: Secondary | ICD-10-CM | POA: Diagnosis not present

## 2021-01-17 DIAGNOSIS — I6389 Other cerebral infarction: Secondary | ICD-10-CM | POA: Diagnosis not present

## 2021-01-17 DIAGNOSIS — G4733 Obstructive sleep apnea (adult) (pediatric): Secondary | ICD-10-CM | POA: Diagnosis not present

## 2021-01-23 DIAGNOSIS — Z1231 Encounter for screening mammogram for malignant neoplasm of breast: Secondary | ICD-10-CM | POA: Diagnosis not present

## 2021-01-23 DIAGNOSIS — R001 Bradycardia, unspecified: Secondary | ICD-10-CM | POA: Diagnosis not present

## 2021-01-23 DIAGNOSIS — R519 Headache, unspecified: Secondary | ICD-10-CM | POA: Diagnosis not present

## 2021-01-23 DIAGNOSIS — G4733 Obstructive sleep apnea (adult) (pediatric): Secondary | ICD-10-CM | POA: Diagnosis not present

## 2021-01-23 DIAGNOSIS — E1129 Type 2 diabetes mellitus with other diabetic kidney complication: Secondary | ICD-10-CM | POA: Diagnosis not present

## 2021-01-23 DIAGNOSIS — I152 Hypertension secondary to endocrine disorders: Secondary | ICD-10-CM | POA: Diagnosis not present

## 2021-01-23 DIAGNOSIS — E1159 Type 2 diabetes mellitus with other circulatory complications: Secondary | ICD-10-CM | POA: Diagnosis not present

## 2021-01-23 DIAGNOSIS — Z7689 Persons encountering health services in other specified circumstances: Secondary | ICD-10-CM | POA: Diagnosis not present

## 2021-01-27 ENCOUNTER — Telehealth: Payer: Self-pay | Admitting: *Deleted

## 2021-01-27 NOTE — Telephone Encounter (Signed)
Notes received on 01/27/21 from [facility], [address], [place], [place] [address]. Phone [phone] Fax: [phone]. Patient doesn't have an appointment. Notes sent to schedulers.

## 2021-01-29 ENCOUNTER — Ambulatory Visit: Payer: Medicare Other | Admitting: Emergency Medicine

## 2021-01-29 ENCOUNTER — Other Ambulatory Visit: Payer: Self-pay

## 2021-01-29 ENCOUNTER — Encounter: Payer: Self-pay | Admitting: Emergency Medicine

## 2021-01-29 DIAGNOSIS — J301 Allergic rhinitis due to pollen: Secondary | ICD-10-CM

## 2021-01-29 DIAGNOSIS — J449 Chronic obstructive pulmonary disease, unspecified: Secondary | ICD-10-CM

## 2021-01-29 DIAGNOSIS — K219 Gastro-esophageal reflux disease without esophagitis: Secondary | ICD-10-CM

## 2021-01-29 DIAGNOSIS — D869 Sarcoidosis, unspecified: Secondary | ICD-10-CM | POA: Diagnosis not present

## 2021-01-29 DIAGNOSIS — E662 Morbid (severe) obesity with alveolar hypoventilation: Secondary | ICD-10-CM

## 2021-01-29 NOTE — Assessment & Plan Note (Signed)
We reviewed your CT chest today. There has been no change compared with 2018. This is good news.  Follow with Dr Delton Coombes in 6 months or sooner if you have any problems

## 2021-01-29 NOTE — Assessment & Plan Note (Signed)
Poor CPAP compliance.  She does need to be back on it.  She has problems with her living situation, inability to plug in the device.  I have tried to help her troubleshoot this.  She is going to work on getting back on CPAP more reliably.

## 2021-01-29 NOTE — Progress Notes (Signed)
Subjective:    Patient ID: Mackenzie Johnson, female    DOB: [birth year], 60 y.o.   MRN: [id number]  HPI  ROV 09/17/20 --Mackenzie Johnson is 86, follows up today for multifactorial chronic hypoxemic respiratory failure.  This is in the setting of OSA/OHS, sarcoidosis with associated obstructive lung disease.  Her last high-res CT chest was done in 2018.  Chest x-rays have been reassuring and stable.  We have been having difficulty getting her oxygen order completed due to barriers in the home and with the DME companies.  She has qualified for oxygen and needs 3 L/min with exertion.  She returns today reporting that she now has a home concentrator and a POC, set on 2L/min. She has not seen any desats on the 2L/min. She is not wearing her CPAP machine reliably - she says the room is too small, and she can't use her O2 concentrator and the CPAP at the same time. Some limitation with exertion, especially shopping - better with the O2.  Managed on Spiriva, uses albuterol approximately 1-2x a day, usually nebs. Rare HFA use.  Loratadine, nexium. She is off flonase. Coughs occasionally, not every day, minimally productive.   ROV 01/29/21 --follow-up visit 60 year old woman who has a history of sarcoidosis with associated struct of lung disease, OSA/OHS and multifactorial chronic hypoxemic respiratory failure.  She has a home concentrator and a POC, uses 2 L/min.  She is supposed to be on CPAP, reports that she has not been using it - she is instead using her O2 to sleep. She is concerned that she has limited power outlets and has to unplug her O2 concentrator to power it up.  She is managed on Spiriva, uses albuterol approximately 1x a day.  She has chronic cough with allergic rhinitis and GERD, has been on loratadine, Nexium. She is off flonase.  She was admitted to [facility] in July for HA and chest pain, bradycardia. She used her CPAP during the hospitalization.    CT chest performed 12/03/20, reviewed by me, shows  patchy parabronchial vascular groundglass change and mild bronchiectasis, stable 4 mm left lower lobe pulmonary nodule, no mediastinal or hilar lymphadenopathy.  All stable compared with 11/17/2016.   Review of Systems As per HPI      Objective:   Physical Exam Vitals:   01/29/21 1056  BP: 128/64  Pulse: 66  SpO2: 98%  Weight: 217 lb (98.4 kg)  Height: 4\' 9"  (1.448 m)    Gen: Pleasant, obese, in no distress,  normal affect  ENT: No lesions,  mouth clear,  oropharynx clear, no postnasal drip  Neck: No JVD, no stridor  Lungs: No use of accessory muscles, very distant but clear, no wheezing, few basilar insp crackles B  Cardiovascular: RRR, heart sounds normal, no murmur or gallops, no edema   Musculoskeletal: No deformities, no cyanosis or clubbing  Neuro: alert, non focal  Skin: Warm, no lesions or rashes      Assessment & Plan:  Sarcoidosis We reviewed your CT chest today. There has been no change compared with 2018. This is good news.  Follow with Dr 2019 in 6 months or sooner if you have any problems  Obesity hypoventilation syndrome Poor CPAP compliance.  She does need to be back on it.  She has problems with her living situation, inability to plug in the device.  I have tried to help her troubleshoot this.  She is going to work on getting back on CPAP more reliably.  GERD (  gastroesophageal reflux disease) On Nexium  COPD (chronic obstructive pulmonary disease) Continue Spiriva once a day Keep your albuterol available to use 2 puffs or 1 nebulizer treatment every 4 hours if needed for shortness of breath, chest tightness, wheezing.  Allergic rhinitis Continue your loratadine once daily Try starting nasal saline spray as you need for dryness and congestion  Time spent 40 minutes  Levy Pupa, MD, PhD 01/29/2021, 11:21 AM [facility] [phone] or if no answer [phone]

## 2021-01-29 NOTE — Assessment & Plan Note (Signed)
On Nexium 

## 2021-01-29 NOTE — Assessment & Plan Note (Signed)
Continue your loratadine once daily Try starting nasal saline spray as you need for dryness and congestion

## 2021-01-29 NOTE — Assessment & Plan Note (Signed)
Continue Spiriva once a day Keep your albuterol available to use 2 puffs or 1 nebulizer treatment every 4 hours if needed for shortness of breath, chest tightness, wheezing.

## 2021-01-29 NOTE — Patient Instructions (Addendum)
We reviewed your CT chest today. There has been no change compared with 2018. This is good news.  You need to work on getting back on your CPAP every night.  Continue your oxygen at 3L/min during the day Continue Spiriva once a day Keep your albuterol available to use 2 puffs or 1 nebulizer treatment every 4 hours if needed for shortness of breath, chest tightness, wheezing. Continue your loratadine once daily Try starting nasal saline spray as you need for dryness and congestion Continue Nexium as you have been taking it Follow with Dr Delton Coombes in 6 months or sooner if you have any problems

## 2021-01-30 ENCOUNTER — Ambulatory Visit: Payer: Medicare Other | Admitting: Cardiology

## 2021-02-14 ENCOUNTER — Telehealth: Payer: Self-pay

## 2021-02-14 NOTE — Telephone Encounter (Signed)
Referral notes received from [facility], Phone #: [phone], Fax #: [phone]   Pt is scheduled to see Dr. Tomie China at the [facility] office on 03/14/21, Notes faxed over to their office, confirmation fax received.

## 2021-02-17 ENCOUNTER — Other Ambulatory Visit: Payer: Self-pay

## 2021-02-17 DIAGNOSIS — F32A Depression, unspecified: Secondary | ICD-10-CM | POA: Insufficient documentation

## 2021-02-17 DIAGNOSIS — I2510 Atherosclerotic heart disease of native coronary artery without angina pectoris: Secondary | ICD-10-CM | POA: Insufficient documentation

## 2021-02-17 DIAGNOSIS — M1990 Unspecified osteoarthritis, unspecified site: Secondary | ICD-10-CM | POA: Insufficient documentation

## 2021-02-17 DIAGNOSIS — I639 Cerebral infarction, unspecified: Secondary | ICD-10-CM

## 2021-02-17 DIAGNOSIS — I10 Essential (primary) hypertension: Secondary | ICD-10-CM | POA: Insufficient documentation

## 2021-02-17 DIAGNOSIS — R519 Headache, unspecified: Secondary | ICD-10-CM | POA: Insufficient documentation

## 2021-02-17 DIAGNOSIS — R001 Bradycardia, unspecified: Secondary | ICD-10-CM | POA: Insufficient documentation

## 2021-02-17 DIAGNOSIS — E039 Hypothyroidism, unspecified: Secondary | ICD-10-CM | POA: Insufficient documentation

## 2021-02-17 HISTORY — DX: Cerebral infarction, unspecified: I63.9

## 2021-02-17 HISTORY — DX: Atherosclerotic heart disease of native coronary artery without angina pectoris: I25.10

## 2021-03-06 ENCOUNTER — Other Ambulatory Visit: Payer: Self-pay

## 2021-03-06 DIAGNOSIS — J45909 Unspecified asthma, uncomplicated: Secondary | ICD-10-CM | POA: Insufficient documentation

## 2021-03-06 DIAGNOSIS — E1129 Type 2 diabetes mellitus with other diabetic kidney complication: Secondary | ICD-10-CM | POA: Insufficient documentation

## 2021-03-06 DIAGNOSIS — E119 Type 2 diabetes mellitus without complications: Secondary | ICD-10-CM | POA: Insufficient documentation

## 2021-03-06 DIAGNOSIS — N19 Unspecified kidney failure: Secondary | ICD-10-CM | POA: Insufficient documentation

## 2021-03-06 DIAGNOSIS — E785 Hyperlipidemia, unspecified: Secondary | ICD-10-CM | POA: Insufficient documentation

## 2021-03-06 DIAGNOSIS — R011 Cardiac murmur, unspecified: Secondary | ICD-10-CM | POA: Insufficient documentation

## 2021-03-06 DIAGNOSIS — D631 Anemia in chronic kidney disease: Secondary | ICD-10-CM | POA: Insufficient documentation

## 2021-03-06 DIAGNOSIS — N189 Chronic kidney disease, unspecified: Secondary | ICD-10-CM | POA: Insufficient documentation

## 2021-03-06 DIAGNOSIS — E7800 Pure hypercholesterolemia, unspecified: Secondary | ICD-10-CM | POA: Insufficient documentation

## 2021-03-06 DIAGNOSIS — E669 Obesity, unspecified: Secondary | ICD-10-CM | POA: Insufficient documentation

## 2021-03-14 ENCOUNTER — Ambulatory Visit: Payer: Medicare Other | Admitting: Cardiology

## 2021-04-14 ENCOUNTER — Other Ambulatory Visit: Payer: Self-pay

## 2021-04-25 ENCOUNTER — Encounter: Payer: Self-pay | Admitting: Cardiology

## 2021-04-25 ENCOUNTER — Ambulatory Visit (INDEPENDENT_AMBULATORY_CARE_PROVIDER_SITE_OTHER): Payer: Medicare Other

## 2021-04-25 ENCOUNTER — Ambulatory Visit (INDEPENDENT_AMBULATORY_CARE_PROVIDER_SITE_OTHER): Payer: Medicare Other | Admitting: Cardiology

## 2021-04-25 ENCOUNTER — Other Ambulatory Visit: Payer: Self-pay

## 2021-04-25 VITALS — BP 132/76 | HR 69 | Ht <= 58 in | Wt 222.8 lb

## 2021-04-25 DIAGNOSIS — R001 Bradycardia, unspecified: Secondary | ICD-10-CM | POA: Diagnosis not present

## 2021-04-25 DIAGNOSIS — E782 Mixed hyperlipidemia: Secondary | ICD-10-CM | POA: Diagnosis not present

## 2021-04-25 DIAGNOSIS — G4733 Obstructive sleep apnea (adult) (pediatric): Secondary | ICD-10-CM | POA: Diagnosis not present

## 2021-04-25 NOTE — Progress Notes (Signed)
Cardiology Office Note:    Date:  04/25/2021   ID:  Mackenzie Johnson, DOB October 09, 1960, MRN [id number]  PCP:  Marlyn Corporal, PA  Cardiologist:  Garwin Brothers, MD   Referring MD: Marlyn Corporal, PA    ASSESSMENT:    1. Sinus bradycardia   2. Mixed hyperlipidemia   3. Obstructive sleep apnea (adult) (pediatric)    PLAN:    In order of problems listed above:  Primary prevention stressed with the patient.  Importance of compliance with diet medication stressed and she vocalized understanding. Sinus bradycardia: Patient has issues and concerns about this so we will do a 2-week monitor. Morbid obesity: Weight reduction stressed.  Risks of obesity explained and she promises to comply. Diabetes mellitus and mixed dyslipidemia: I discussed these findings with the patient at length I told her about lifestyle modification and how she increases her risk for mortality and morbidity from her multiple conditions and she promises to do better. Patient will be seen in follow-up appointment in 6 months or earlier if the patient has any concerns    Medication Adjustments/Labs and Tests Ordered: Current medicines are reviewed at length with the patient today.  Concerns regarding medicines are outlined above.  Orders Placed This Encounter  Procedures   LONG TERM MONITOR (3-14 DAYS)   EKG 12-Lead   No orders of the defined types were placed in this encounter.    No chief complaint on file.    History of Present Illness:    Mackenzie Johnson is a 60 y.o. female.  Patient has past medical history of diabetes mellitus, essential hypertension and mixed dyslipidemia.  She is morbidly obese and leads a sedentary lifestyle.  She was evaluated for bradycardia by Dr. Dulce Sellar at the hospital.  Her evaluation was unremarkable.  She had no significant bradycardia.  Her echocardiogram was also fine.  She has coronary angiography a few years ago which was completely normal with 0% stenosis in all arteries.  At  the time of my evaluation, the patient is alert awake oriented and in no distress.  Past Medical History:  Diagnosis Date   Allergic rhinitis 11/14/2013   Anemia in CKD (chronic kidney disease)    Arthritis    Asthma    Bradycardia    CAD (coronary artery disease) 02/17/2021   Chest pain 03/12/2016   Chronic anemia 12/02/2012   Chronic bronchitis (HCC) 02/27/2015   Chronic respiratory failure with hypoxia (HCC) 01/13/2017   CKD (chronic kidney disease), stage III (HCC) 12/02/2012   COPD (chronic obstructive pulmonary disease) (HCC)    CVA (cerebral vascular accident) (HCC) 02/17/2021   Depression    Diabetes mellitus (HCC)    Diabetes mellitus type 2 in obese (HCC) 12/02/2012   Diabetes mellitus with renal complications (HCC)    DOE (dyspnea on exertion) 04/04/2018   GERD (gastroesophageal reflux disease)    Headache    Heart murmur    Hypercholesteremia    Hyperlipemia    Hypertension    Hypothyroidism    Kidney failure    Leg pain 12/02/2012   Mixed hyperlipidemia 02/27/2015   Morbid (severe) obesity due to excess calories (HCC) 02/27/2015   CPAP   Normal coronary arteries 04/04/2018   Obesity    Obesity hypoventilation syndrome (HCC) 02/22/2013   Obstructive sleep apnea (adult) (pediatric) 02/22/2013   Renal insufficiency 02/27/2015   RLS (restless legs syndrome) 12/02/2012   Sarcoidosis     Past Surgical History:  Procedure Laterality Date  CHOLECYSTECTOMY     DILATION AND CURETTAGE OF UTERUS      Current Medications: Current Meds  Medication Sig   aspirin 81 MG chewable tablet Chew 81 mg by mouth daily.   Cholecalciferol 50 MCG (2000 UT) TABS Take 50 mcg by mouth daily.   co-enzyme Q-10 30 MG capsule Take 100 mg by mouth daily.   esomeprazole (NEXIUM) 40 MG capsule Take 40 mg by mouth daily.   furosemide (LASIX) 20 MG tablet Take 1-2 tablets by mouth daily.   LANTUS SOLOSTAR 100 UNIT/ML Solostar Pen Inject 30 Units into the skin at bedtime.   linaclotide (LINZESS) 145 MCG  CAPS capsule Take 145 mcg by mouth daily before breakfast.   losartan (COZAAR) 25 MG tablet Take 25 mg by mouth daily.   nitroGLYCERIN (NITROSTAT) 0.4 MG SL tablet Place 0.4 mg under the tongue every 5 (five) minutes as needed for chest pain.   nystatin (MYCOSTATIN/NYSTOP) powder Apply 1 application topically 2 (two) times daily.   OXYGEN Inhale 3 L into the lungs continuous.   ranolazine (RANEXA) 500 MG 12 hr tablet Take 500 mg by mouth 2 (two) times daily.   rosuvastatin (CRESTOR) 40 MG tablet Take 40 mg by mouth daily.   sertraline (ZOLOFT) 100 MG tablet Take 200 mg by mouth daily.   tiotropium (SPIRIVA HANDIHALER) 18 MCG inhalation capsule INHALE 1 CAPSULE VIA HANDIHALER ONCE DAILY AT THE SAME TIME EVERY DAY (Patient taking differently: Place 18 mcg into inhaler and inhale as directed. INHALE 1 CAPSULE VIA HANDIHALER ONCE DAILY AT THE SAME TIME EVERY DAY)   traZODone (DESYREL) 100 MG tablet Take 200 mg by mouth at bedtime.     Allergies:   Sulfa antibiotics, Sulfabenzamide, and Spike lavender [lavandula latifolia]   Social History   Socioeconomic History   Marital status: Divorced    Spouse name: Not on file   Number of children: 0   Years of education: Not on file   Highest education level: Not on file  Occupational History   Occupation: disability    Comment: pulmonary/depression  Tobacco Use   Smoking status: Former    Packs/day: 0.10    Years: 2.00    Pack years: 0.20    Types: Cigarettes    Quit date: 06/29/1976    Years since quitting: 44.8   Smokeless tobacco: Never   Tobacco comments:    as a teenager  Vaping Use   Vaping Use: Never used  Substance and Sexual Activity   Alcohol use: No   Drug use: No   Sexual activity: Not on file  Other Topics Concern   Not on file  Social History Narrative   Not on file   Social Determinants of Health   Financial Resource Strain: Not on file  Food Insecurity: Not on file  Transportation Needs: Not on file  Physical  Activity: Not on file  Stress: Not on file  Social Connections: Not on file     Family History: The patient's family history includes Arthritis in an other family member; Cancer in her father and sister; Diabetes in her maternal grandmother and mother; Heart disease in her mother; Hypercholesterolemia in her father and mother; Hyperlipidemia in her father and mother; Hypertension in her father, maternal grandfather, maternal grandmother, and mother; Kidney disease in her maternal grandmother and mother; Stroke in her father, maternal grandfather, and mother.  ROS:   Please see the history of present illness.    All other systems reviewed and are negative.  EKGs/Labs/Other Studies Reviewed:    The following studies were reviewed today: I discussed my findings with the patient at length.  EKG reveals sinus rhythm and nonspecific ST-T changes    Recent Labs: No results found for requested labs within last 8760 hours.  Recent Lipid Panel No results found for: CHOL, TRIG, HDL, CHOLHDL, VLDL, LDLCALC, LDLDIRECT  Physical Exam:    VS:  BP 132/76   Pulse 69   Ht 4\' 9"  (1.448 m)   Wt 222 lb 12.8 oz (101.1 kg)   SpO2 94%   BMI 48.21 kg/m     Wt Readings from Last 3 Encounters:  04/25/21 222 lb 12.8 oz (101.1 kg)  01/29/21 217 lb (98.4 kg)  09/17/20 204 lb 9.6 oz (92.8 kg)     GEN: Patient is in no acute distress HEENT: Normal NECK: No JVD; No carotid bruits LYMPHATICS: No lymphadenopathy CARDIAC: Hear sounds regular, 2/6 systolic murmur at the apex. RESPIRATORY:  Clear to auscultation without rales, wheezing or rhonchi  ABDOMEN: Soft, non-tender, non-distended MUSCULOSKELETAL:  No edema; No deformity  SKIN: Warm and dry NEUROLOGIC:  Alert and oriented x 3 PSYCHIATRIC:  Normal affect   Signed, 09/19/20, MD  04/25/2021 4:05 PM    [facility]

## 2021-04-25 NOTE — Patient Instructions (Signed)
Medication Instructions:  Your physician recommends that you continue on your current medications as directed. Please refer to the Current Medication list given to you today.  *If you need a refill on your cardiac medications before your next appointment, please call your pharmacy*   Lab Work: None ordered If you have labs (blood work) drawn today and your tests are completely normal, you will receive your results only by: MyChart Message (if you have MyChart) OR A paper copy in the mail If you have any lab test that is abnormal or we need to change your treatment, we will call you to review the results.   Testing/Procedures:  WHY IS MY DOCTOR PRESCRIBING ZIO? The Zio system is proven and trusted by physicians to detect and diagnose irregular heart rhythms -- and has been prescribed to hundreds of thousands of patients.  The FDA has cleared the Zio system to monitor for many different kinds of irregular heart rhythms. In a study, physicians were able to reach a diagnosis 90% of the time with the Zio system1.  You can wear the Zio monitor -- a small, discreet, comfortable patch -- during your normal day-to-day activity, including while you sleep, shower, and exercise, while it records every single heartbeat for analysis.  1Barrett, P., et al. Comparison of 24 Hour Holter Monitoring Versus 14 Day Novel Adhesive Patch Electrocardiographic Monitoring. American Journal of Medicine, 2014.  ZIO VS. HOLTER MONITORING The Zio monitor can be comfortably worn for up to 14 days. Holter monitors can be worn for 24 to 48 hours, limiting the time to record any irregular heart rhythms you may have. Zio is able to capture data for the 51% of patients who have their first symptom-triggered arrhythmia after 48 hours.1  LIVE WITHOUT RESTRICTIONS The Zio ambulatory cardiac monitor is a small, unobtrusive, and water-resistant patch--you might even forget you're wearing it. The Zio monitor records and stores  every beat of your heart, whether you're sleeping, working out, or showering.  Remove 05/09/21.   Follow-Up: At [facility], you and your health needs are our priority.  As part of our continuing mission to provide you with exceptional heart care, we have created designated Provider Care Teams.  These Care Teams include your primary Cardiologist (physician) and Advanced Practice Providers (APPs -  Physician Assistants and Nurse Practitioners) who all work together to provide you with the care you need, when you need it.  We recommend signing up for the patient portal called "MyChart".  Sign up information is provided on this After Visit Summary.  MyChart is used to connect with patients for Virtual Visits (Telemedicine).  Patients are able to view lab/test results, encounter notes, upcoming appointments, etc.  Non-urgent messages can be sent to your provider as well.   To learn more about what you can do with MyChart, go to [email or web address].    Your next appointment:   6 month(s)  The format for your next appointment:   In Person  Provider:   Belva Crome, MD   Other Instructions NA

## 2021-05-09 DIAGNOSIS — R001 Bradycardia, unspecified: Secondary | ICD-10-CM | POA: Diagnosis not present

## 2021-05-09 LAB — COLOGUARD: COLOGUARD: POSITIVE — AB

## 2021-07-06 ENCOUNTER — Other Ambulatory Visit: Payer: Self-pay | Admitting: Emergency Medicine

## 2021-08-05 ENCOUNTER — Ambulatory Visit (INDEPENDENT_AMBULATORY_CARE_PROVIDER_SITE_OTHER): Payer: Medicare Other | Admitting: Emergency Medicine

## 2021-08-05 ENCOUNTER — Other Ambulatory Visit: Payer: Self-pay

## 2021-08-05 ENCOUNTER — Encounter: Payer: Self-pay | Admitting: Emergency Medicine

## 2021-08-05 DIAGNOSIS — D869 Sarcoidosis, unspecified: Secondary | ICD-10-CM

## 2021-08-05 DIAGNOSIS — J301 Allergic rhinitis due to pollen: Secondary | ICD-10-CM | POA: Diagnosis not present

## 2021-08-05 DIAGNOSIS — G4733 Obstructive sleep apnea (adult) (pediatric): Secondary | ICD-10-CM | POA: Diagnosis not present

## 2021-08-05 DIAGNOSIS — J449 Chronic obstructive pulmonary disease, unspecified: Secondary | ICD-10-CM

## 2021-08-05 DIAGNOSIS — J9611 Chronic respiratory failure with hypoxia: Secondary | ICD-10-CM | POA: Diagnosis not present

## 2021-08-05 NOTE — Assessment & Plan Note (Signed)
Continue 3L/min 

## 2021-08-05 NOTE — Assessment & Plan Note (Signed)
Continue loratadine.  Discussed restarting nasal saline rinses with her today she is going to pursue this.

## 2021-08-05 NOTE — Patient Instructions (Addendum)
Please continue Spiriva 2 puffs once daily. Keep your albuterol available to use 2 puffs when you needed for shortness of breath, chest tightness, wheezing. Continue your oxygen at 3 L/min at all times. Use your CPAP at night while sleeping. Continue loratadine 10 mg once daily. Try restarting your nasal saline rinses Continue your Nexium as you have been taking it Follow Dr. Delton Coombes in 1 year or sooner if you have any problems.

## 2021-08-05 NOTE — Assessment & Plan Note (Signed)
Her CT chest is been stable, obstructive disease stable.  We will follow-up in 1 year or sooner if she has any problems.  We can determine timing of any repeat imaging based on her clinical status.

## 2021-08-05 NOTE — Assessment & Plan Note (Signed)
Overall stable without flaring.  Plan to continue same regimen, Spiriva and albuterol if needed.

## 2021-08-05 NOTE — Progress Notes (Signed)
°  Subjective:    Patient ID: Mackenzie Johnson, female    DOB: [birth year], 61 y.o.   MRN: [id number]  HPI  ROV 01/29/21 --follow-up visit 61 year old woman who has a history of sarcoidosis with associated struct of lung disease, OSA/OHS and multifactorial chronic hypoxemic respiratory failure.  She has a home concentrator and a POC, uses 2 L/min.  She is supposed to be on CPAP, reports that she has not been using it - she is instead using her O2 to sleep. She is concerned that she has limited power outlets and has to unplug her O2 concentrator to power it up.  She is managed on Spiriva, uses albuterol approximately 1x a day.  She has chronic cough with allergic rhinitis and GERD, has been on loratadine, Nexium. She is off flonase.  She was admitted to [facility] in July for HA and chest pain, bradycardia. She used her CPAP during the hospitalization.   CT chest performed 12/03/20, reviewed by me, shows patchy parabronchial vascular groundglass change and mild bronchiectasis, stable 4 mm left lower lobe pulmonary nodule, no mediastinal or hilar lymphadenopathy.  All stable compared with 11/17/2016.  ROV 08/05/21 --61 year old woman with sarcoidosis and associated obstructive disease.  Also with OSA/OHS (retrying CPAP) and multifactorial chronic hypoxemic respiratory failure.  Stable CT chest in 11/2020. She is managed on Spiriva, uses albuterol occasionally - with shopping or more strenuous exertion.  She is on loratadine, Nexium.  At her last visit we discussed trying to restart nasal saline - she did not do this. Formerly on flonase. She is having some nasal dryness, congestion.  She is wearing her CPAP more reliably - usually able to get get through the night with it. It helps her breathing and her daytime sleepiness.    Review of Systems As per HPI     Objective:   Physical Exam Vitals:   08/05/21 1329  BP: 118/74  Pulse: 69  Temp: 98 F (36.7 C)  TempSrc: Oral  SpO2: 93%  Weight: 227 lb 9.6 oz  (103.2 kg)  Height: 4\' 10"  (1.473 m)    Gen: Pleasant, obese, in no distress,  normal affect  ENT: No lesions,  mouth clear,  oropharynx clear, no postnasal drip  Neck: No JVD, no stridor  Lungs: No use of accessory muscles, very distant but clear, no wheezing, few basilar insp crackles B  Cardiovascular: RRR, heart sounds normal, no murmur or gallops, no edema   Musculoskeletal: No deformities, no cyanosis or clubbing  Neuro: alert, non focal  Skin: Warm, no lesions or rashes      Assessment & Plan:  COPD (chronic obstructive pulmonary disease) (HCC) Overall stable without flaring.  Plan to continue same regimen, Spiriva and albuterol if needed.  Obstructive sleep apnea (adult) (pediatric) She was able to restart her CPAP and has better compliance.  Encouraged her in this.  Plan to continue  Chronic respiratory failure with hypoxia (HCC) Continue 3 L/min  Allergic rhinitis Continue loratadine.  Discussed restarting nasal saline rinses with her today she is going to pursue this.  Sarcoidosis Her CT chest is been stable, obstructive disease stable.  We will follow-up in 1 year or sooner if she has any problems.  We can determine timing of any repeat imaging based on her clinical status.    , MD, PhD 08/05/2021, 1:46 PM [facility] [phone] or if no answer [phone]

## 2021-08-05 NOTE — Assessment & Plan Note (Signed)
She was able to restart her CPAP and has better compliance.  Encouraged her in this.  Plan to continue

## 2021-10-28 ENCOUNTER — Ambulatory Visit (INDEPENDENT_AMBULATORY_CARE_PROVIDER_SITE_OTHER): Payer: Medicare Other | Admitting: Cardiology

## 2021-10-28 ENCOUNTER — Encounter: Payer: Self-pay | Admitting: Cardiology

## 2021-10-28 VITALS — BP 148/78 | HR 84 | Ht <= 58 in | Wt 227.2 lb

## 2021-10-28 DIAGNOSIS — E669 Obesity, unspecified: Secondary | ICD-10-CM

## 2021-10-28 DIAGNOSIS — E782 Mixed hyperlipidemia: Secondary | ICD-10-CM | POA: Diagnosis not present

## 2021-10-28 DIAGNOSIS — R079 Chest pain, unspecified: Secondary | ICD-10-CM

## 2021-10-28 DIAGNOSIS — I10 Essential (primary) hypertension: Secondary | ICD-10-CM

## 2021-10-28 DIAGNOSIS — E1169 Type 2 diabetes mellitus with other specified complication: Secondary | ICD-10-CM

## 2021-10-28 HISTORY — DX: Chest pain, unspecified: R07.9

## 2021-10-28 MED ORDER — NITROGLYCERIN 0.4 MG SL SUBL: 0.4000 mg | SUBLINGUAL_TABLET | SUBLINGUAL | 6 refills | Status: DC | PRN

## 2021-10-28 NOTE — Progress Notes (Signed)
?Cardiology Office Note:   ? ?Date:  10/28/2021  ? ?ID:  Mackenzie Johnson, DOB 01-18-1961, MRN [id number] ? ?PCP:  Renaldo Reel, PA  ?Cardiologist:  Jenean Lindau, MD  ? ?Referring MD: Renaldo Reel, PA  ? ? ?ASSESSMENT:   ? ?1. Coronary artery disease involving native coronary artery of native heart without angina pectoris   ?2. Mixed hyperlipidemia   ?3. Diabetes mellitus type 2 in obese [facility])   ?4. Primary hypertension   ?5. Morbid (severe) obesity due to excess calories ([place])   ?6. Chest pain of uncertain etiology   ? ?PLAN:   ? ?In order of problems listed above: ? ?Primary prevention stressed with the patient.  Importance of compliance with diet medication stressed and she vocalized understanding. ?Chest pain: Her coronary angiography in 2017 was normal but she has multiple risk factors and we will do a Lexiscan sestamibi to assess her symptoms.  Sublingual nitroglycerin prescription was sent, its protocol and 911 protocol explained and the patient vocalized understanding questions were answered to the patient's satisfaction ?Essential hypertension: Blood pressure stable and diet was emphasized.  She has an element of whitecoat hypertension.  Her blood pressures at home are fine. ?Mixed dyslipidemia and diabetes mellitus: Managed by primary care.  Diet emphasized. ?Morbid obesity and COPD: Again lifestyle modification urged and these are managed by primary care.  She knows to go to the nearest emergency room for any concerning symptoms. ?Patient will be seen in follow-up appointment in 6 months or earlier if the patient has any concerns ? ? ? ?Medication Adjustments/Labs and Tests Ordered: ?Current medicines are reviewed at length with the patient today.  Concerns regarding medicines are outlined above.  ?Orders Placed This Encounter  ?Procedures  ? Cardiac Stress Test: Informed Consent Details: Physician/Practitioner Attestation; Transcribe to consent form and obtain patient signature  ? MYOCARDIAL PERFUSION  IMAGING  ? EKG 12-Lead  ? ?Meds ordered this encounter  ?Medications  ? nitroGLYCERIN (NITROSTAT) 0.4 MG SL tablet  ?  Sig: Place 1 tablet (0.4 mg total) under the tongue every 5 (five) minutes as needed for chest pain.  ?  Dispense:  25 tablet  ?  Refill:  6  ? ? ? ?No chief complaint on file. ?  ? ?History of Present Illness:   ? ?Mackenzie Johnson is a 61 y.o. female.  Patient has past medical history of essential hypertension, mixed dyslipidemia, diabetes mellitus, COPD and morbid obesity.  Patient mentions to me that she occasionally has chest tightness.  She has not used nitroglycerin.  This happens at rest.  She has had coronary angiography and I reviewed the notes from 2017 and that reflect normal coronary arteries.  At the time of my evaluation, the patient is alert awake oriented and in no distress. ? ?Past Medical History:  ?Diagnosis Date  ? Allergic rhinitis 11/14/2013  ? Anemia in CKD (chronic kidney disease)   ? Arthritis   ? Asthma   ? Bradycardia   ? CAD (coronary artery disease) 02/17/2021  ? Chest pain 03/12/2016  ? Chronic anemia 12/02/2012  ? Chronic bronchitis ([place]) 02/27/2015  ? Chronic respiratory failure with hypoxia ([place]) 01/13/2017  ? CKD (chronic kidney disease), stage III ([place]) 12/02/2012  ? COPD (chronic obstructive pulmonary disease) ([place])   ? CVA (cerebral vascular accident) ([place]) 02/17/2021  ? Depression   ? Diabetes mellitus ([place])   ? Diabetes mellitus type 2 in obese ([place]) 12/02/2012  ? Diabetes mellitus with renal complications (  [place])   ? DOE (dyspnea on exertion) 04/04/2018  ? GERD (gastroesophageal reflux disease)   ? Headache   ? Heart murmur   ? Hypercholesteremia   ? Hyperlipemia   ? Hypertension   ? Hypothyroidism   ? Kidney failure   ? Leg pain 12/02/2012  ? Mixed hyperlipidemia 02/27/2015  ? Morbid (severe) obesity due to excess calories ([place]) 02/27/2015  ? CPAP  ? Normal coronary arteries 04/04/2018  ? Obesity   ? Obesity hypoventilation syndrome ([place]) 02/22/2013  ? Obstructive sleep apnea  (adult) (pediatric) 02/22/2013  ? Renal insufficiency 02/27/2015  ? RLS (restless legs syndrome) 12/02/2012  ? Sarcoidosis   ? ? ?Past Surgical History:  ?Procedure Laterality Date  ? CHOLECYSTECTOMY    ? DILATION AND CURETTAGE OF UTERUS    ? ? ?Current Medications: ?Current Meds  ?Medication Sig  ? aspirin 81 MG chewable tablet Chew 81 mg by mouth daily.  ? Cholecalciferol 50 MCG (2000 UT) TABS Take 50 mcg by mouth daily.  ? co-enzyme Q-10 30 MG capsule Take 100 mg by mouth daily.  ? esomeprazole (NEXIUM) 40 MG capsule Take 40 mg by mouth daily.  ? furosemide (LASIX) 20 MG tablet Take 1-2 tablets by mouth daily.  ? LANTUS SOLOSTAR 100 UNIT/ML Solostar Pen Inject 30 Units into the skin at bedtime.  ? linaclotide (LINZESS) 145 MCG CAPS capsule Take 145 mcg by mouth daily before breakfast.  ? losartan (COZAAR) 25 MG tablet Take 25 mg by mouth daily.  ? nitroGLYCERIN (NITROSTAT) 0.4 MG SL tablet Place 1 tablet (0.4 mg total) under the tongue every 5 (five) minutes as needed for chest pain.  ? nystatin (MYCOSTATIN/NYSTOP) powder Apply 1 application topically 2 (two) times daily.  ? OXYGEN Inhale 3 L into the lungs continuous.  ? ranolazine (RANEXA) 500 MG 12 hr tablet Take 500 mg by mouth 2 (two) times daily.  ? rosuvastatin (CRESTOR) 40 MG tablet Take 40 mg by mouth daily.  ? sertraline (ZOLOFT) 100 MG tablet Take 200 mg by mouth daily.  ? SPIRIVA HANDIHALER 18 MCG inhalation capsule INHALE 1 CAPSULE VIA HANDIHALER ONCE DAILY AT THE SAME TIME EVERY DAY  ? traZODone (DESYREL) 100 MG tablet Take 200 mg by mouth at bedtime.  ? [DISCONTINUED] nitroGLYCERIN (NITROSTAT) 0.4 MG SL tablet Place 0.4 mg under the tongue every 5 (five) minutes as needed for chest pain.  ?  ? ?Allergies:   Sulfa antibiotics, Sulfabenzamide, and Spike lavender [lavandula latifolia]  ? ?Social History  ? ?Socioeconomic History  ? Marital status: Divorced  ?  Spouse name: Not on file  ? Number of children: 0  ? Years of education: Not on file  ? Highest  education level: Not on file  ?Occupational History  ? Occupation: disability  ?  Comment: pulmonary/depression  ?Tobacco Use  ? Smoking status: Former  ?  Packs/day: 0.10  ?  Years: 2.00  ?  Pack years: 0.20  ?  Types: Cigarettes  ?  Quit date: 06/29/1976  ?  Years since quitting: 45.3  ? Smokeless tobacco: Never  ? Tobacco comments:  ?  as a teenager  ?Vaping Use  ? Vaping Use: Never used  ?Substance and Sexual Activity  ? Alcohol use: No  ? Drug use: No  ? Sexual activity: Not on file  ?Other Topics Concern  ? Not on file  ?Social History Narrative  ? Not on file  ? ?Social Determinants of Health  ? ?Financial Resource Strain: Not on file  ?  Food Insecurity: Not on file  ?Transportation Needs: Not on file  ?Physical Activity: Not on file  ?Stress: Not on file  ?Social Connections: Not on file  ?  ? ?Family History: ?The patient's family history includes Arthritis in an other family member; Cancer in her father and sister; Diabetes in her maternal grandmother and mother; Heart disease in her mother; Hypercholesterolemia in her father and mother; Hyperlipidemia in her father and mother; Hypertension in her father, maternal grandfather, maternal grandmother, and mother; Kidney disease in her maternal grandmother and mother; Stroke in her father, maternal grandfather, and mother. ? ?ROS:   ?Please see the history of present illness.    ?All other systems reviewed and are negative. ? ?EKGs/Labs/Other Studies Reviewed:   ? ?The following studies were reviewed today: ?EKG reveals sinus rhythm and nonspecific ST-T changes ? ? ?Recent Labs: ?No results found for requested labs within last 8760 hours.  ?Recent Lipid Panel ?No results found for: CHOL, TRIG, HDL, CHOLHDL, VLDL, LDLCALC, LDLDIRECT ? ?Physical Exam:   ? ?VS:  BP (!) 148/78   Pulse 84   Ht 4\' 9"  (1.448 m)   Wt 227 lb 3.2 oz (103.1 kg)   SpO2 95%   BMI 49.17 kg/m?    ? ?Wt Readings from Last 3 Encounters:  ?10/28/21 227 lb 3.2 oz (103.1 kg)  ?08/05/21 227 lb  9.6 oz (103.2 kg)  ?04/25/21 222 lb 12.8 oz (101.1 kg)  ?  ? ?GEN: Patient is in no acute distress ?HEENT: Normal ?NECK: No JVD; No carotid bruits ?LYMPHATICS: No lymphadenopathy ?CARDIAC: Hear sounds regular,

## 2021-10-28 NOTE — Patient Instructions (Signed)
Medication Instructions:  ?Your physician has recommended you make the following change in your medication:  ? ?Use nitroglycerin 1 tablet placed under the tongue at the first sign of chest pain or an angina attack. 1 tablet may be used every 5 minutes as needed, for up to 15 minutes. Do not take more than 3 tablets in 15 minutes. If pain persist call 911 or go to the nearest ED. ? ? ?*If you need a refill on your cardiac medications before your next appointment, please call your pharmacy* ? ? ?Lab Work: ?None ordered ?If you have labs (blood work) drawn today and your tests are completely normal, you will receive your results only by: ?MyChart Message (if you have MyChart) OR ?A paper copy in the mail ?If you have any lab test that is abnormal or we need to change your treatment, we will call you to review the results. ? ? ?Testing/Procedures: ?Your physician has requested that you have a lexiscan myoview. For further information please visit [email or web address]. Please follow instruction sheet, as given. ? ?The test will done over 2 days and take approximately 3 to 4 hours to complete; you may bring reading material.  If someone comes with you to your appointment, they will need to remain in the main lobby due to limited space in the testing area.  ? ?How to prepare for your Myocardial Perfusion Test: ?Do not eat or drink 3 hours prior to your test, except you may have water. ?Do not consume products containing caffeine (regular or decaffeinated) 12 hours prior to your test. (ex: coffee, chocolate, sodas, tea). ?Do bring a list of your current medications with you.  If not listed below, you may take your medications as normal. ?Do wear comfortable clothes (no dresses or overalls) and walking shoes, tennis shoes preferred (No heels or open toe shoes are allowed). ?Do NOT wear cologne, perfume, aftershave, or lotions (deodorant is allowed). ?If these instructions are not followed, your test will have to be  rescheduled. ? ? ? ?Follow-Up: ?[facility], you and your health needs are our priority.  As part of our continuing mission to provide you with exceptional heart care, we have created designated Provider Care Teams.  These Care Teams include your primary Cardiologist (physician) and Advanced Practice Providers (APPs -  Physician Assistants and Nurse Practitioners) who all work together to provide you with the care you need, when you need it. ? ?We recommend signing up for the patient portal called "MyChart".  Sign up information is provided on this After Visit Summary.  MyChart is used to connect with patients for Virtual Visits (Telemedicine).  Patients are able to view lab/test results, encounter notes, upcoming appointments, etc.  Non-urgent messages can be sent to your provider as well.   ?To learn more about what you can do with MyChart, go to [email or web address].   ? ?Your next appointment:   ?9 month(s) ? ?The format for your next appointment:   ?In Person ? ?Provider:   ?Belva Cromeajan Revankar, MD ? ? ?Other Instructions ?Cardiac Nuclear Scan ?A cardiac nuclear scan is a test that is done to check the flow of blood to your heart. It is done when you are resting and when you are exercising. The test looks for problems such as: ?Not enough blood reaching a portion of the heart. ?The heart muscle not working as it should. ?You may need this test if: ?You have heart disease. ?You have had lab results that are not normal. ?You have  had heart surgery or a balloon procedure to open up blocked arteries (angioplasty). ?You have chest pain. ?You have shortness of breath. ?In this test, a special dye (tracer) is put into your bloodstream. The tracer will travel to your heart. A camera will then take pictures of your heart to see how the tracer moves through your heart. This test is usually done at a hospital and takes 2-4 hours. ?Tell a doctor about: ?Any allergies you have. ?All medicines you are taking, including  vitamins, herbs, eye drops, creams, and over-the-counter medicines. ?Any problems you or family members have had with anesthetic medicines. ?Any blood disorders you have. ?Any surgeries you have had. ?Any medical conditions you have. ?Whether you are pregnant or may be pregnant. ?What are the risks? ?Generally, this is a safe test. However, problems may occur, such as: ?Serious chest pain and heart attack. This is only a risk if the stress portion of the test is done. ?Rapid heartbeat. ?A feeling of warmth in your chest. This feeling usually does not last long. ?Allergic reaction to the tracer. ?What happens before the test? ?Ask your doctor about changing or stopping your normal medicines. This is important. ?Follow instructions from your doctor about what you cannot eat or drink. ?Remove your jewelry on the day of the test. ?What happens during the test? ?An IV tube will be inserted into one of your veins. ?Your doctor will give you a small amount of tracer through the IV tube. ?You will wait for 20-40 minutes while the tracer moves through your bloodstream. ?Your heart will be monitored with an electrocardiogram (ECG). ?You will lie down on an exam table. ?Pictures of your heart will be taken for about 15-20 minutes. ?You may also have a stress test. For this test, one of these things may be done: ?You will be asked to exercise on a treadmill or a stationary bike. ?You will be given medicines that will make your heart work harder. This is done if you are unable to exercise. ?When blood flow to your heart has peaked, a tracer will again be given through the IV tube. ?After 20-40 minutes, you will get back on the exam table. More pictures will be taken of your heart. ?Depending on the tracer that is used, more pictures may need to be taken 3-4 hours later. ?Your IV tube will be removed when the test is over. ?The test may vary among doctors and hospitals. ?What happens after the test? ?Ask your doctor: ?Whether you  can return to your normal schedule, including diet, activities, and medicines. ?Whether you should drink more fluids. This will help to remove the tracer from your body. Drink enough fluid to keep your pee (urine) pale yellow. ?Ask your doctor, or the department that is doing the test: ?When will my results be ready? ?How will I get my results? ?Summary ?A cardiac nuclear scan is a test that is done to check the flow of blood to your heart. ?Tell your doctor whether you are pregnant or may be pregnant. ?Before the test, ask your doctor about changing or stopping your normal medicines. This is important. ?Ask your doctor whether you can return to your normal activities. You may be asked to drink more fluids. ?This information is not intended to replace advice given to you by your health care provider. Make sure you discuss any questions you have with your health care provider. ?Document Revised: 10/05/2018 Document Reviewed: 11/29/2017 ?Elsevier Patient Education ? 2021 Elsevier Inc. ? ? ?  Nitroglycerin Sublingual Tablets ?What is this medication? ?NITROGLYCERIN (nye troe GLI ser in) prevents and treats chest pain (angina). It works by relaxing blood vessels, which decreases the amount of work the heart has to do. It belongs to a group of medications called nitrates. ?This medicine may be used for other purposes; ask your health care provider or pharmacist if you have questions. ?COMMON BRAND NAME(S): Nitroquick, Nitrostat, Nitrotab ?What should I tell my care team before I take this medication? ?They need to know if you have any of these conditions: ?Anemia ?Head injury, recent stroke, or bleeding in the brain ?Liver disease ?Previous heart attack ?An unusual or allergic reaction to nitroglycerin, other medications, foods, dyes, or preservatives ?Pregnant or trying to get pregnant ?Breast-feeding ?How should I use this medication? ?Take this medication by mouth as needed. Use at the first sign of an angina attack  (chest pain or tightness). You can also take this medication 5 to 10 minutes before an event likely to produce chest pain. Follow the directions exactly as written on the prescription label. Place one tablet unde

## 2021-11-05 ENCOUNTER — Telehealth: Payer: Self-pay | Admitting: *Deleted

## 2021-11-05 NOTE — Telephone Encounter (Signed)
Left message on voicemail per DPR in reference to upcoming appointment scheduled on 11/12/21 at 1115 with detailed instructions given per Myocardial Perfusion Study Information Sheet for the test. LM to arrive 15 minutes early, and that it is imperative to arrive on time for appointment to keep from having the test rescheduled. If you need to cancel or reschedule your appointment, please call the office within 24 hours of your appointment. Failure to do so may result in a cancellation of your appointment, and a $50 no show fee. Phone number given for call back for any questions. Hafiz Irion, Adelene Idler ? ? ?

## 2021-11-05 NOTE — Telephone Encounter (Signed)
Patient was returning your call. Please advise  

## 2021-11-07 IMAGING — DX DG CHEST 2V
2 series · 2 of 2 positions shown · non-contrast
Comparison: Chest x-ray 01/10/2019.

CLINICAL DATA: 59-year-old female with history of shortness of
breath. COPD.

EXAM:
CHEST - 2 VIEW

[chest pa]
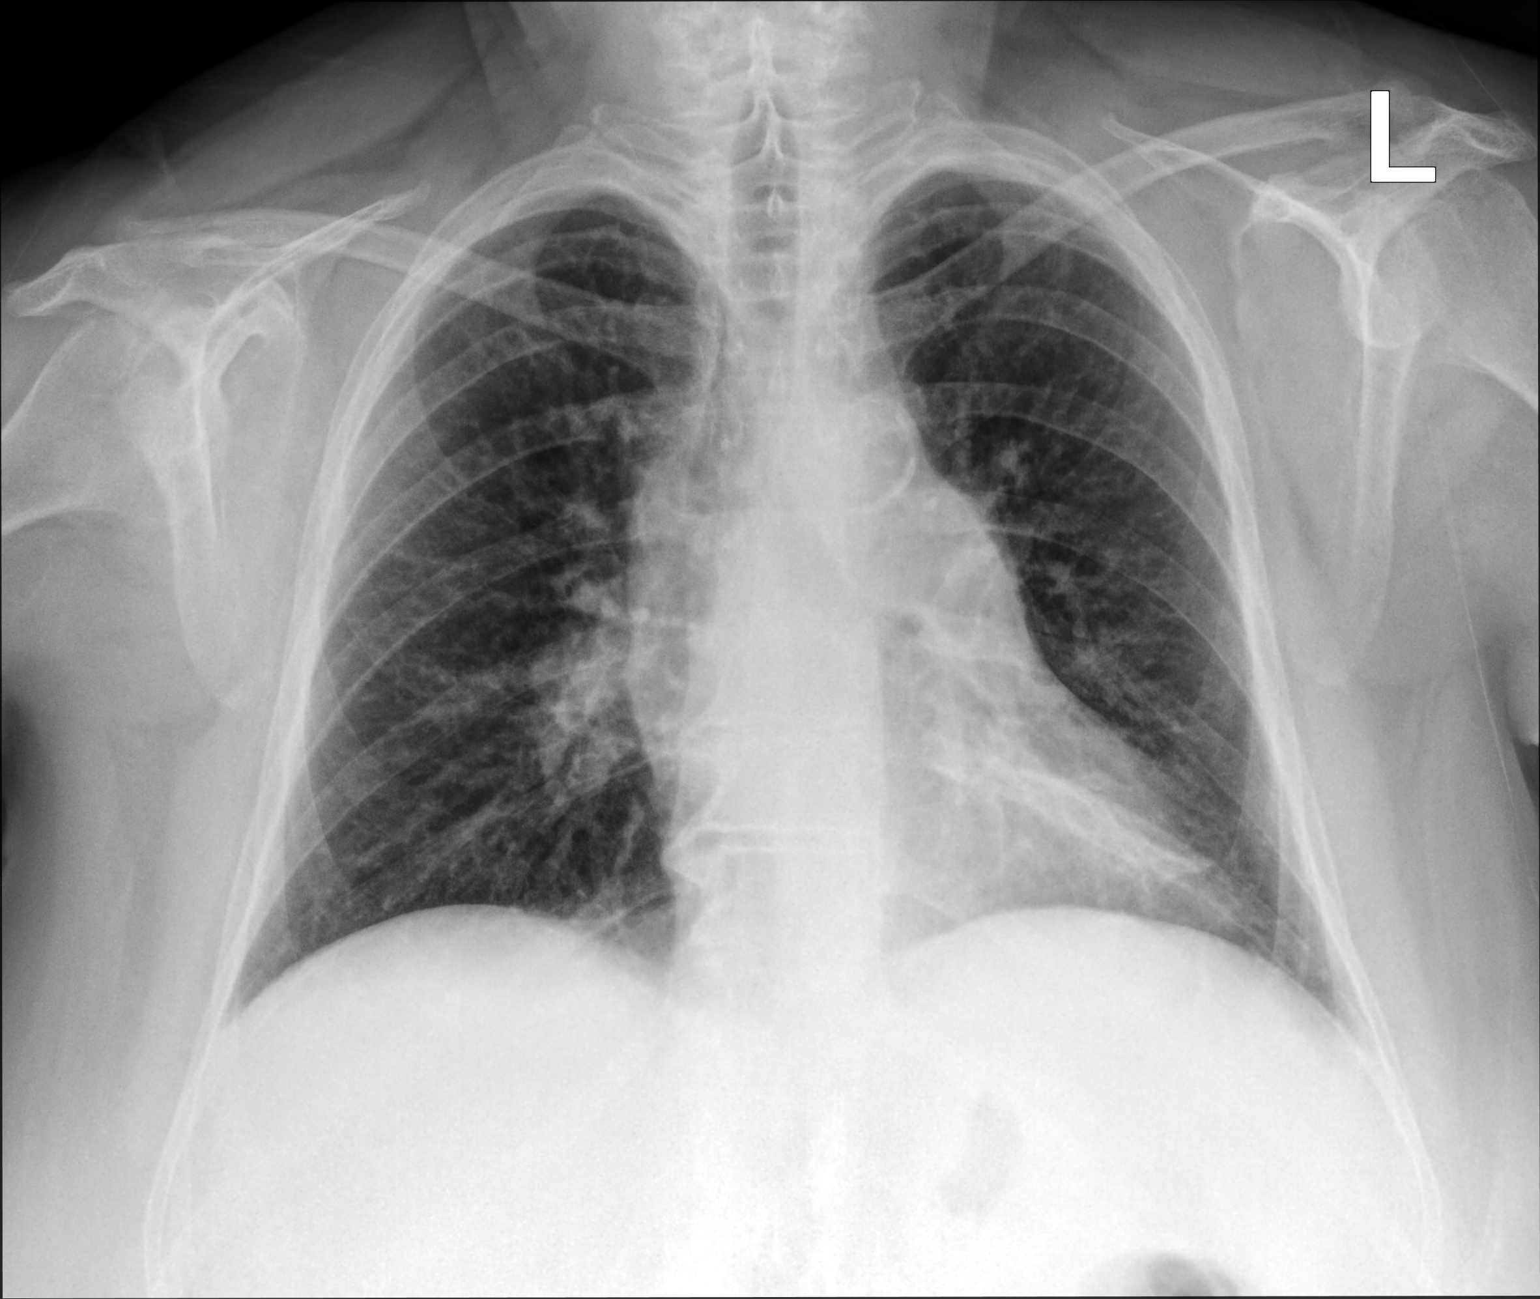

[chest lat]
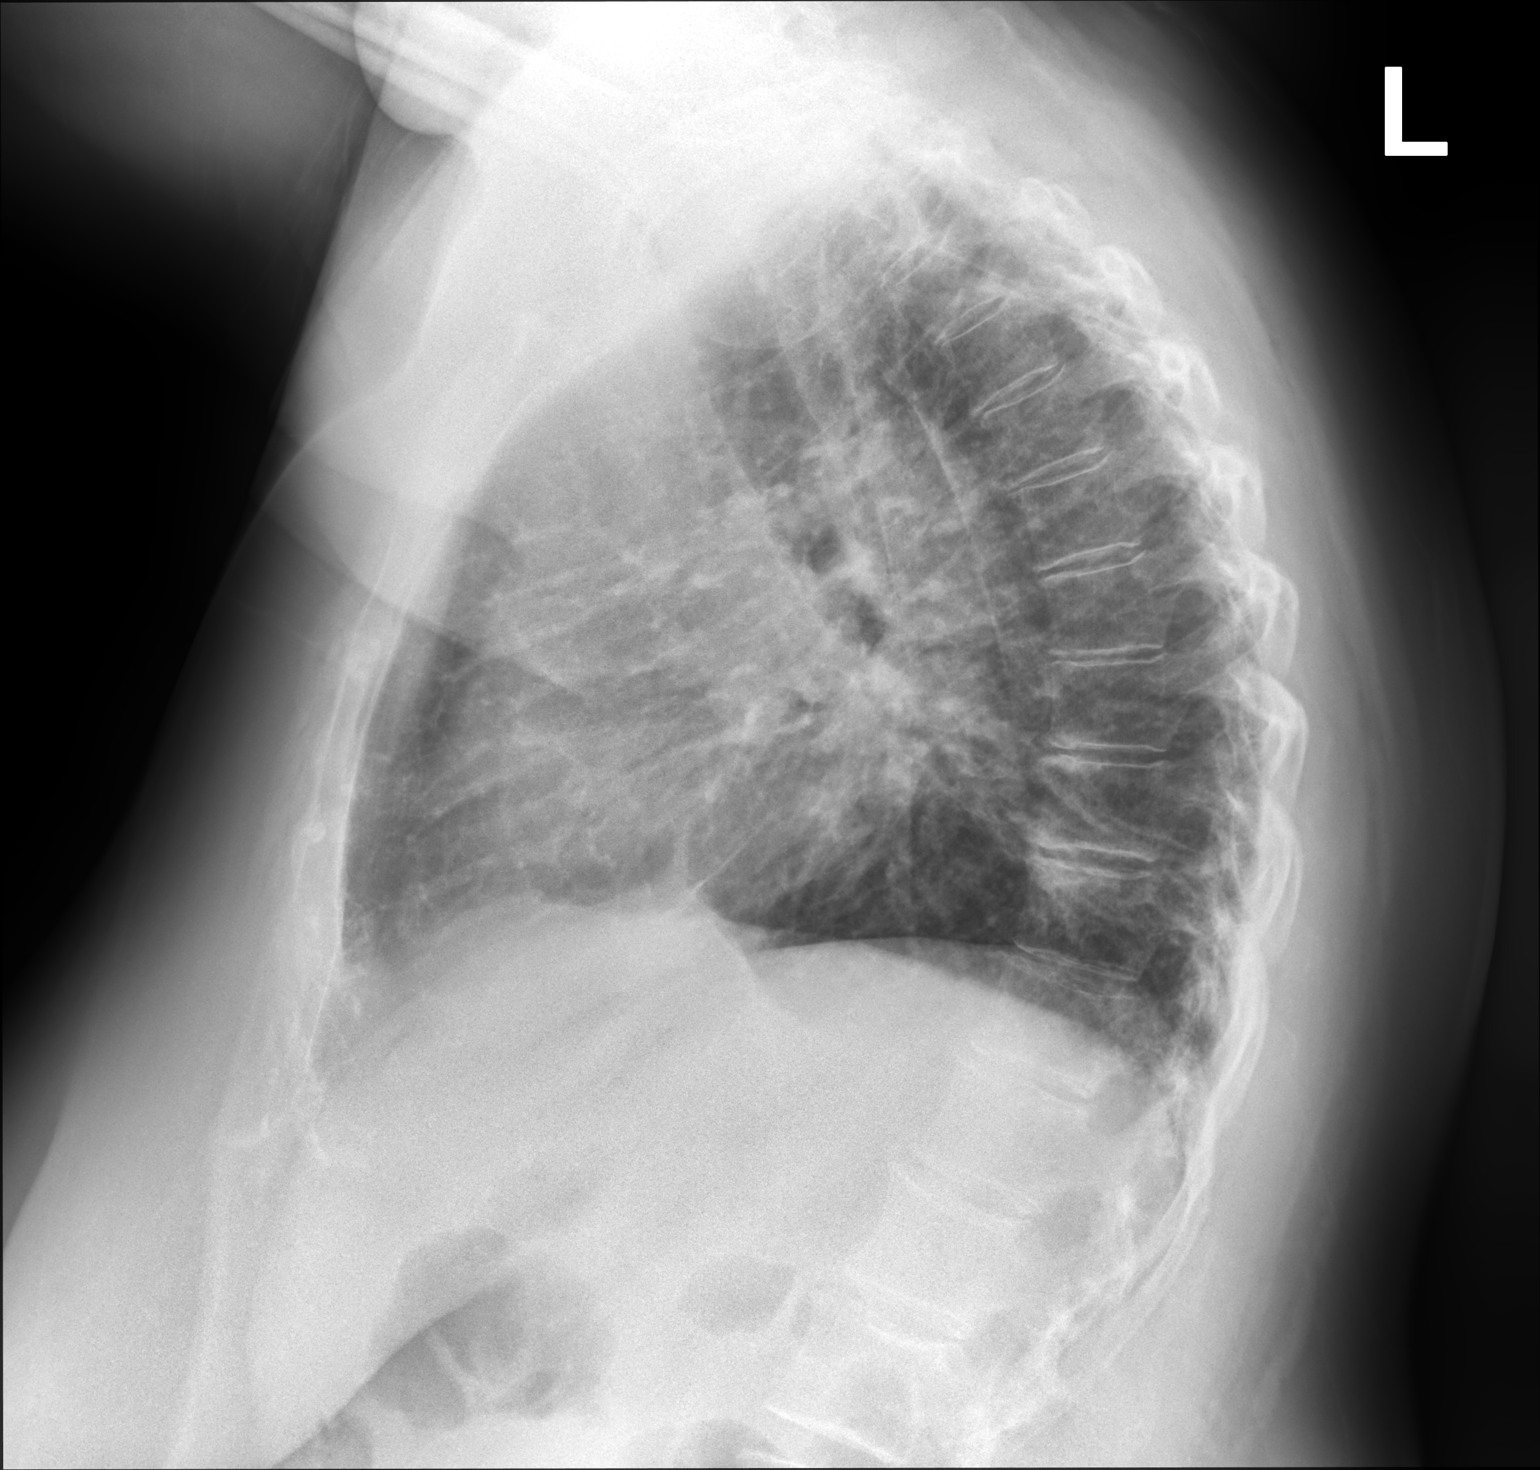

[2 of 2 positions shown; findings below may reference images not displayed]

FINDINGS: Mild chronic scarring in the left lower lobe. Lung volumes are
normal. No consolidative airspace disease. No pleural effusions. No
pneumothorax. No pulmonary nodule or mass noted. Pulmonary
vasculature and the cardiomediastinal silhouette are within normal
limits. Atherosclerosis in the thoracic aorta.
IMPRESSION: 1. No radiographic evidence of acute cardiopulmonary disease.
2. Mild chronic scarring in the left lower lobe, stable compared to
the prior examination.
3. Aortic atherosclerosis.

## 2021-11-12 ENCOUNTER — Ambulatory Visit (INDEPENDENT_AMBULATORY_CARE_PROVIDER_SITE_OTHER): Payer: Medicare Other

## 2021-11-12 DIAGNOSIS — R079 Chest pain, unspecified: Secondary | ICD-10-CM | POA: Diagnosis not present

## 2021-11-12 DIAGNOSIS — G4733 Obstructive sleep apnea (adult) (pediatric): Secondary | ICD-10-CM | POA: Diagnosis not present

## 2021-11-12 DIAGNOSIS — R0689 Other abnormalities of breathing: Secondary | ICD-10-CM | POA: Diagnosis not present

## 2021-11-13 ENCOUNTER — Ambulatory Visit: Payer: Medicare Other

## 2021-11-13 LAB — MYOCARDIAL PERFUSION IMAGING
LV dias vol: 65 mL (ref 46–106)
LV sys vol: 17 mL
Nuc Stress EF: 74 %
Peak HR: 88 {beats}/min
Rest HR: 69 {beats}/min
Rest Nuclear Isotope Dose: 31.9 mCi
SDS: 5
SRS: 1
SSS: 6
ST Depression (mm): 0 mm
Stress Nuclear Isotope Dose: 30.6 mCi
TID: 0.95

## 2021-11-13 MED ORDER — TECHNETIUM TC 99M TETROFOSMIN IV KIT
30.6000 | PACK | Freq: Once | INTRAVENOUS | Status: AC | PRN
Start: 2021-11-13 — End: 2021-11-12
  Administered 2021-11-12: 30.6 via INTRAVENOUS

## 2021-11-13 MED ORDER — REGADENOSON 0.4 MG/5ML IV SOLN
0.4000 mg | Freq: Once | INTRAVENOUS | Status: AC
  Administered 2021-11-12: 0.4 mg via INTRAVENOUS

## 2021-11-13 MED ORDER — TECHNETIUM TC 99M TETROFOSMIN IV KIT
31.9000 | PACK | Freq: Once | INTRAVENOUS | Status: AC | PRN
  Administered 2021-11-13: 31.9 via INTRAVENOUS

## 2021-11-19 ENCOUNTER — Telehealth: Payer: Self-pay | Admitting: Cardiology

## 2021-11-19 ENCOUNTER — Telehealth: Payer: Self-pay

## 2021-11-19 DIAGNOSIS — K219 Gastro-esophageal reflux disease without esophagitis: Secondary | ICD-10-CM | POA: Diagnosis not present

## 2021-11-19 DIAGNOSIS — E1129 Type 2 diabetes mellitus with other diabetic kidney complication: Secondary | ICD-10-CM | POA: Diagnosis not present

## 2021-11-19 DIAGNOSIS — R072 Precordial pain: Secondary | ICD-10-CM

## 2021-11-19 DIAGNOSIS — I152 Hypertension secondary to endocrine disorders: Secondary | ICD-10-CM | POA: Diagnosis not present

## 2021-11-19 DIAGNOSIS — E785 Hyperlipidemia, unspecified: Secondary | ICD-10-CM | POA: Diagnosis not present

## 2021-11-19 DIAGNOSIS — R079 Chest pain, unspecified: Secondary | ICD-10-CM

## 2021-11-19 DIAGNOSIS — E1159 Type 2 diabetes mellitus with other circulatory complications: Secondary | ICD-10-CM | POA: Diagnosis not present

## 2021-11-19 DIAGNOSIS — N183 Chronic kidney disease, stage 3 unspecified: Secondary | ICD-10-CM | POA: Diagnosis not present

## 2021-11-19 DIAGNOSIS — D860 Sarcoidosis of lung: Secondary | ICD-10-CM | POA: Diagnosis not present

## 2021-11-19 MED ORDER — METOPROLOL TARTRATE 100 MG PO TABS: 100.0000 mg | ORAL_TABLET | Freq: Once | ORAL | 0 refills | Status: DC

## 2021-11-19 NOTE — Telephone Encounter (Signed)
-----   Message from Garwin Brothers, MD sent at 11/13/2021  5:17 PM EDT ----- Please make sure she has nitroglycerin and aspirin.  Set her up for CT FFR.  Copy primary care Garwin Brothers, MD 11/13/2021 5:17 PM

## 2021-11-19 NOTE — Telephone Encounter (Signed)
   Pt is returning call to get stress test result  

## 2021-11-19 NOTE — Telephone Encounter (Signed)
Results reviewed with pt as per Dr. Kem Parkinson note.  Pt verbalized understanding and had no additional questions. Routed to PCP.  Pt has nitroglycerin and aspirin. Instructions reviewed via phone and mailed in a letter to the pt regarding her cardiac CT. Pt verbalized understanding and had no additional questions.

## 2021-11-19 NOTE — Telephone Encounter (Signed)
Results reviewed with pt as per Dr. Revankar's note.  Pt verbalized understanding and had no additional questions. Routed to PCP.  

## 2021-11-20 ENCOUNTER — Encounter: Payer: Self-pay | Admitting: Cardiology

## 2021-11-20 NOTE — Telephone Encounter (Signed)
error 

## 2021-11-28 ENCOUNTER — Telehealth: Payer: Self-pay

## 2021-11-28 NOTE — Telephone Encounter (Signed)
Spoke with pt who confirms she is taking her Rosuvastatin 40 mg daily.

## 2021-12-03 DIAGNOSIS — R079 Chest pain, unspecified: Secondary | ICD-10-CM | POA: Diagnosis not present

## 2021-12-04 LAB — BASIC METABOLIC PANEL
BUN/Creatinine Ratio: 16 (ref 12–28)
BUN: 18 mg/dL (ref 8–27)
CO2: 27 mmol/L (ref 20–29)
Calcium: 9.2 mg/dL (ref 8.7–10.3)
Chloride: 101 mmol/L (ref 96–106)
Creatinine, Ser: 1.14 mg/dL — ABNORMAL HIGH (ref 0.57–1.00)
Glucose: 164 mg/dL — ABNORMAL HIGH (ref 70–99)
Potassium: 4.1 mmol/L (ref 3.5–5.2)
Sodium: 143 mmol/L (ref 134–144)
eGFR: 54 mL/min/{1.73_m2} — ABNORMAL LOW (ref >59)

## 2021-12-08 ENCOUNTER — Telehealth (HOSPITAL_COMMUNITY): Payer: Self-pay | Admitting: *Deleted

## 2021-12-08 NOTE — Telephone Encounter (Signed)
Reaching out to patient to offer assistance regarding upcoming cardiac imaging study; pt verbalizes understanding of appt date/time, parking situation and where to check in, pre-test NPO status and medications ordered, and verified current allergies; name and call back number provided for further questions should they arise ? ?Deray Dawes RN Navigator Cardiac Imaging ?[facility] ?[phone] office ?[phone] cell ? ?Patient to take 100mg metoprolol tartrate two hours prior to her cardiac CT scan.  She is aware to arrive at 1:30pm. ? ?

## 2021-12-10 ENCOUNTER — Ambulatory Visit (HOSPITAL_COMMUNITY)
Admission: RE | Admit: 2021-12-10 | Discharge: 2021-12-10 | Disposition: A | Discharge status: Home / Self Care | Payer: Medicare Other | Source: Ambulatory Visit | Attending: Cardiology | Admitting: Cardiology

## 2021-12-10 DIAGNOSIS — R072 Precordial pain: Secondary | ICD-10-CM | POA: Insufficient documentation

## 2021-12-10 DIAGNOSIS — R079 Chest pain, unspecified: Secondary | ICD-10-CM | POA: Insufficient documentation

## 2021-12-10 MED ORDER — NITROGLYCERIN 0.4 MG SL SUBL
0.8000 mg | SUBLINGUAL_TABLET | Freq: Once | SUBLINGUAL | Status: AC
  Administered 2021-12-10: 0.8 mg via SUBLINGUAL

## 2021-12-10 MED ORDER — METOPROLOL TARTRATE 5 MG/5ML IV SOLN: 5.0000 mg | INTRAVENOUS | Status: DC | PRN

## 2021-12-10 MED ORDER — NITROGLYCERIN 0.4 MG SL SUBL
SUBLINGUAL_TABLET | SUBLINGUAL | Status: AC
  Filled 2021-12-10: qty 2

## 2021-12-10 MED ORDER — METOPROLOL TARTRATE 5 MG/5ML IV SOLN
INTRAVENOUS | Status: AC
  Administered 2021-12-10: 2.5 mg via INTRAVENOUS
  Filled 2021-12-10: qty 5

## 2021-12-10 MED ORDER — IOHEXOL 350 MG/ML SOLN
100.0000 mL | Freq: Once | INTRAVENOUS | Status: AC | PRN
  Administered 2021-12-10: 100 mL via INTRAVENOUS

## 2021-12-13 DIAGNOSIS — G4733 Obstructive sleep apnea (adult) (pediatric): Secondary | ICD-10-CM | POA: Diagnosis not present

## 2021-12-13 DIAGNOSIS — R0689 Other abnormalities of breathing: Secondary | ICD-10-CM | POA: Diagnosis not present

## 2022-01-12 DIAGNOSIS — R0689 Other abnormalities of breathing: Secondary | ICD-10-CM | POA: Diagnosis not present

## 2022-01-12 DIAGNOSIS — G4733 Obstructive sleep apnea (adult) (pediatric): Secondary | ICD-10-CM | POA: Diagnosis not present

## 2022-01-19 ENCOUNTER — Telehealth: Payer: Self-pay | Admitting: Cardiology

## 2022-01-19 NOTE — Telephone Encounter (Signed)
   Pre-operative Risk Assessment    Patient Name: Mackenzie Johnson  DOB: July 10, 1960 MRN: [id number]      Request for Surgical Clearance    Procedure:  GI surgery  Date of Surgery:  Clearance TBD                                 Surgeon:   Dr. Charm Barges Surgeon's Group or Practice Name:   Phone number:  [phone] Fax number:     Type of Clearance Requested:   - Medical    Type of Anesthesia:  Not Indicated   Additional requests/questions:    Patient stated her GI doctor would like clearance for her to have surgery    Signed, Annetta Maw   01/19/2022, 10:22 AM

## 2022-01-20 NOTE — Telephone Encounter (Signed)
   Name: Mackenzie Johnson  DOB: 28-Dec-1960  MRN: [id number]  Primary Cardiologist: Garwin Brothers, MD  Chart reviewed as part of pre-operative protocol coverage. Because of KOLBI TOFTE past medical history and time since last visit, she will require a follow-up in-office visit in order to better assess preoperative cardiovascular risk.  Pre-op covering staff: - Please schedule appointment and call patient to inform them. If patient already had an upcoming appointment within acceptable timeframe, please add "pre-op clearance" to the appointment notes so provider is aware. - Please contact requesting surgeon's office via preferred method (i.e, phone, fax) to inform them of need for appointment prior to surgery.   Joylene Grapes, NP  01/20/2022, 11:06 AM  sd

## 2022-01-20 NOTE — Telephone Encounter (Signed)
PLEASE DISREGARD THE CORRECTION NOTES FROM EARLIER TODAY; THE NOTE IS ON THE CORRECT PT AS THIS IS THE INFORMATION FOR DR. Charm Barges OFFICE. PLEASE ACCEPT MY APOLOGIES.    Pt agreeable to in office appt 01/29/22 @ 2:20 with Dr. Tomie China in the [place] office. I will update the requesting office the pt has an appt 01/29/22 for pre op clearance.

## 2022-01-20 NOTE — Telephone Encounter (Signed)
CORRECTION; WRONG ; PLEASE DISREGARD INFORMATION I HAVE JUST NOTED FOR PH AND FAX # FOR DR. Charm Barges.

## 2022-01-20 NOTE — Telephone Encounter (Signed)
I have a found a phone and fax # to fax clearance note after appt on 02/05/22 if pt has been cleared.   Dr. [facility]  Ph# [phone] Fax# [phone]

## 2022-01-29 ENCOUNTER — Encounter: Payer: Self-pay | Admitting: Cardiology

## 2022-01-29 ENCOUNTER — Ambulatory Visit (INDEPENDENT_AMBULATORY_CARE_PROVIDER_SITE_OTHER): Payer: Medicare Other | Admitting: Cardiology

## 2022-01-29 VITALS — BP 108/66 | HR 69 | Ht <= 58 in | Wt 226.4 lb

## 2022-01-29 DIAGNOSIS — E669 Obesity, unspecified: Secondary | ICD-10-CM

## 2022-01-29 DIAGNOSIS — E1169 Type 2 diabetes mellitus with other specified complication: Secondary | ICD-10-CM | POA: Diagnosis not present

## 2022-01-29 DIAGNOSIS — I10 Essential (primary) hypertension: Secondary | ICD-10-CM

## 2022-01-29 DIAGNOSIS — E782 Mixed hyperlipidemia: Secondary | ICD-10-CM

## 2022-01-29 NOTE — Patient Instructions (Signed)

## 2022-01-29 NOTE — Progress Notes (Signed)
Cardiology Office Note:    Date:  01/29/2022   ID:  Mackenzie Johnson, DOB 12-Dec-1960, MRN [id number]  PCP:  Marga Hoots, NP  Cardiologist:  Jenean Lindau, MD   Referring MD: Carvel Getting Key, *    ASSESSMENT:    1. Primary hypertension   2. Mixed hyperlipidemia   3. Diabetes mellitus type 2 in obese [facility])    PLAN:    In order of problems listed above:  Primary prevention stressed with the patient.  Importance of compliance with diet medication stressed and she vocalized understanding. Essential hypertension: Blood pressure stable and diet was emphasized. CT coronary angiography reveals a calcium score of 0 and essentially normal coronary arteries.  In view of this she is at low risk for procedures such as colonoscopy.  This is from a cardiovascular standpoint. Abnormal CT lung with hilar lymph nodes appearing prominent.  She is aware of this.  He is awaiting a call from a pulmonologist and tells me that if she does not hear by the end of the week she will call them early next week for advice. Morbid obesity: Weight reduction stressed diet emphasized.  Lifestyle modification urged risks of obesity discussed and she vocalized understanding and questions were answered to her satisfaction.  She will be seen on a follow-up appointment on a as needed basis.   Medication Adjustments/Labs and Tests Ordered: Current medicines are reviewed at length with the patient today.  Concerns regarding medicines are outlined above.  No orders of the defined types were placed in this encounter.  No orders of the defined types were placed in this encounter.    No chief complaint on file.    History of Present Illness:    Mackenzie Johnson is a 61 y.o. female.  Patient has past medical history of COPD, stroke, essential hypertension dyslipidemia diabetes mellitus and morbid obesity.  She was evaluated for abnormal stress test.  Fortunately CT coronary angiography was within normal  limits with a calcium score of 0 and she is happy about it.  CT of the lungs was abnormal and she is awaiting a call from a pulmonologist.  Past Medical History:  Diagnosis Date   Allergic rhinitis 11/14/2013   Anemia in CKD (chronic kidney disease)    Arthritis    Asthma    Bradycardia    Chest pain 03/12/2016   Chest pain of uncertain etiology 0000000   Chronic anemia 12/02/2012   Chronic bronchitis ([place]) 02/27/2015   Chronic respiratory failure with hypoxia ([place]) 01/13/2017   CKD (chronic kidney disease), stage III ([place]) 12/02/2012   COPD (chronic obstructive pulmonary disease) ([facility])    CVA (cerebral vascular accident) ([place]) 02/17/2021   Depression    Diabetes mellitus ([place])    Diabetes mellitus type 2 in obese ([place]) 12/02/2012   Diabetes mellitus with renal complications ([facility])    DOE (dyspnea on exertion) 04/04/2018   GERD (gastroesophageal reflux disease)    Headache    Heart murmur    Hypercholesteremia    Hyperlipemia    Hypertension    Hypothyroidism    Kidney failure    Leg pain 12/02/2012   Mixed hyperlipidemia 02/27/2015   Morbid (severe) obesity due to excess calories ([place]) 02/27/2015   CPAP   Normal coronary arteries 04/04/2018   Obesity    Obesity hypoventilation syndrome ([place]) 02/22/2013   Obstructive sleep apnea (adult) (pediatric) 02/22/2013   Renal insufficiency 02/27/2015   RLS (restless legs syndrome) 12/02/2012   Sarcoidosis  Past Surgical History:  Procedure Laterality Date   CHOLECYSTECTOMY     DILATION AND CURETTAGE OF UTERUS      Current Medications: Current Meds  Medication Sig   aspirin 81 MG chewable tablet Chew 81 mg by mouth daily.   Cholecalciferol 50 MCG (2000 UT) TABS Take 50 mcg by mouth daily.   co-enzyme Q-10 30 MG capsule Take 100 mg by mouth daily.   esomeprazole (NEXIUM) 40 MG capsule Take 40 mg by mouth daily.   furosemide (LASIX) 20 MG tablet Take 1-2 tablets by mouth daily.   LANTUS SOLOSTAR 100 UNIT/ML Solostar  Pen Inject 30 Units into the skin at bedtime.   linaclotide (LINZESS) 145 MCG CAPS capsule Take 145 mcg by mouth daily before breakfast.   losartan (COZAAR) 25 MG tablet Take 25 mg by mouth daily.   nitroGLYCERIN (NITROSTAT) 0.4 MG SL tablet Place 1 tablet (0.4 mg total) under the tongue every 5 (five) minutes as needed for chest pain.   nystatin (MYCOSTATIN/NYSTOP) powder Apply 1 application topically 2 (two) times daily.   OXYGEN Inhale 3 L into the lungs continuous.   ranolazine (RANEXA) 500 MG 12 hr tablet Take 500 mg by mouth 2 (two) times daily.   rosuvastatin (CRESTOR) 40 MG tablet Take 40 mg by mouth daily.   sertraline (ZOLOFT) 100 MG tablet Take 200 mg by mouth daily.   SPIRIVA HANDIHALER 18 MCG inhalation capsule INHALE 1 CAPSULE VIA HANDIHALER ONCE DAILY AT THE SAME TIME EVERY DAY   traZODone (DESYREL) 100 MG tablet Take 200 mg by mouth at bedtime.     Allergies:   Sulfa antibiotics, Sulfabenzamide, and Spike lavender [lavandula latifolia]   Social History   Socioeconomic History   Marital status: Divorced    Spouse name: Not on file   Number of children: 0   Years of education: Not on file   Highest education level: Not on file  Occupational History   Occupation: disability    Comment: pulmonary/depression  Tobacco Use   Smoking status: Former    Packs/day: 0.10    Years: 2.00    Total pack years: 0.20    Types: Cigarettes    Quit date: 06/29/1976    Years since quitting: 45.6   Smokeless tobacco: Never   Tobacco comments:    as a teenager  Vaping Use   Vaping Use: Never used  Substance and Sexual Activity   Alcohol use: No   Drug use: No   Sexual activity: Not on file  Other Topics Concern   Not on file  Social History Narrative   Not on file   Social Determinants of Health   Financial Resource Strain: Not on file  Food Insecurity: Not on file  Transportation Needs: Not on file  Physical Activity: Not on file  Stress: Not on file  Social Connections:  Not on file     Family History: The patient's family history includes Arthritis in an other family member; Cancer in her father and sister; Diabetes in her maternal grandmother and mother; Heart disease in her mother; Hypercholesterolemia in her father and mother; Hyperlipidemia in her father and mother; Hypertension in her father, maternal grandfather, maternal grandmother, and mother; Kidney disease in her maternal grandmother and mother; Stroke in her father, maternal grandfather, and mother.  ROS:   Please see the history of present illness.    All other systems reviewed and are negative.  EKGs/Labs/Other Studies Reviewed:    The following studies were reviewed today: I  discussed my findings with the patient at length.  EKG was sinus rhythm left anterior fascicular block and right bundle branch block.  Recent Labs: 12/03/2021: BUN 18; Creatinine, Ser 1.14; Potassium 4.1; Sodium 143  Recent Lipid Panel No results found for: "CHOL", "TRIG", "HDL", "CHOLHDL", "VLDL", "LDLCALC", "LDLDIRECT"  Physical Exam:    VS:  BP 108/66   Pulse 69   Ht 4\' 10"  (1.473 m)   Wt 226 lb 6.4 oz (102.7 kg)   SpO2 96%   BMI 47.32 kg/m     Wt Readings from Last 3 Encounters:  01/29/22 226 lb 6.4 oz (102.7 kg)  11/12/21 227 lb (103 kg)  10/28/21 227 lb 3.2 oz (103.1 kg)     GEN: Patient is in no acute distress HEENT: Normal NECK: No JVD; No carotid bruits LYMPHATICS: No lymphadenopathy CARDIAC: Hear sounds regular, 2/6 systolic murmur at the apex. RESPIRATORY:  Clear to auscultation without rales, wheezing or rhonchi  ABDOMEN: Soft, non-tender, non-distended MUSCULOSKELETAL:  No edema; No deformity  SKIN: Warm and dry NEUROLOGIC:  Alert and oriented x 3 PSYCHIATRIC:  Normal affect   Signed, 12/28/21, MD  01/29/2022 2:53 PM    [facility]

## 2022-02-05 ENCOUNTER — Other Ambulatory Visit: Payer: Self-pay

## 2022-02-05 DIAGNOSIS — R079 Chest pain, unspecified: Secondary | ICD-10-CM

## 2022-02-05 MED ORDER — NITROGLYCERIN 0.4 MG SL SUBL: 0.4000 mg | SUBLINGUAL_TABLET | SUBLINGUAL | 11 refills | Status: AC | PRN

## 2022-02-12 DIAGNOSIS — R0689 Other abnormalities of breathing: Secondary | ICD-10-CM | POA: Diagnosis not present

## 2022-02-12 DIAGNOSIS — G4733 Obstructive sleep apnea (adult) (pediatric): Secondary | ICD-10-CM | POA: Diagnosis not present

## 2022-02-20 DIAGNOSIS — D860 Sarcoidosis of lung: Secondary | ICD-10-CM | POA: Diagnosis not present

## 2022-02-20 DIAGNOSIS — E1129 Type 2 diabetes mellitus with other diabetic kidney complication: Secondary | ICD-10-CM | POA: Diagnosis not present

## 2022-02-20 DIAGNOSIS — E785 Hyperlipidemia, unspecified: Secondary | ICD-10-CM | POA: Diagnosis not present

## 2022-02-23 ENCOUNTER — Telehealth: Payer: Self-pay | Admitting: Emergency Medicine

## 2022-02-25 NOTE — Telephone Encounter (Signed)
This has been updated in the system.

## 2022-02-27 DIAGNOSIS — Z1231 Encounter for screening mammogram for malignant neoplasm of breast: Secondary | ICD-10-CM | POA: Diagnosis not present

## 2022-03-15 DIAGNOSIS — R0689 Other abnormalities of breathing: Secondary | ICD-10-CM | POA: Diagnosis not present

## 2022-03-15 DIAGNOSIS — G4733 Obstructive sleep apnea (adult) (pediatric): Secondary | ICD-10-CM | POA: Diagnosis not present

## 2022-04-14 DIAGNOSIS — R0689 Other abnormalities of breathing: Secondary | ICD-10-CM | POA: Diagnosis not present

## 2022-04-14 DIAGNOSIS — G4733 Obstructive sleep apnea (adult) (pediatric): Secondary | ICD-10-CM | POA: Diagnosis not present

## 2022-05-13 DIAGNOSIS — H43822 Vitreomacular adhesion, left eye: Secondary | ICD-10-CM | POA: Diagnosis not present

## 2022-05-13 DIAGNOSIS — H40013 Open angle with borderline findings, low risk, bilateral: Secondary | ICD-10-CM | POA: Diagnosis not present

## 2022-05-13 DIAGNOSIS — E119 Type 2 diabetes mellitus without complications: Secondary | ICD-10-CM | POA: Diagnosis not present

## 2022-05-13 DIAGNOSIS — H04123 Dry eye syndrome of bilateral lacrimal glands: Secondary | ICD-10-CM | POA: Diagnosis not present

## 2022-05-13 DIAGNOSIS — H401122 Primary open-angle glaucoma, left eye, moderate stage: Secondary | ICD-10-CM | POA: Diagnosis not present

## 2022-05-13 DIAGNOSIS — H25813 Combined forms of age-related cataract, bilateral: Secondary | ICD-10-CM | POA: Diagnosis not present

## 2022-05-15 DIAGNOSIS — R0689 Other abnormalities of breathing: Secondary | ICD-10-CM | POA: Diagnosis not present

## 2022-05-15 DIAGNOSIS — G4733 Obstructive sleep apnea (adult) (pediatric): Secondary | ICD-10-CM | POA: Diagnosis not present

## 2022-05-25 DIAGNOSIS — K219 Gastro-esophageal reflux disease without esophagitis: Secondary | ICD-10-CM | POA: Diagnosis not present

## 2022-05-25 DIAGNOSIS — N183 Chronic kidney disease, stage 3 unspecified: Secondary | ICD-10-CM | POA: Diagnosis not present

## 2022-05-25 DIAGNOSIS — N390 Urinary tract infection, site not specified: Secondary | ICD-10-CM | POA: Diagnosis not present

## 2022-05-25 DIAGNOSIS — E1159 Type 2 diabetes mellitus with other circulatory complications: Secondary | ICD-10-CM | POA: Diagnosis not present

## 2022-05-25 DIAGNOSIS — D860 Sarcoidosis of lung: Secondary | ICD-10-CM | POA: Diagnosis not present

## 2022-05-25 DIAGNOSIS — I152 Hypertension secondary to endocrine disorders: Secondary | ICD-10-CM | POA: Diagnosis not present

## 2022-05-25 DIAGNOSIS — E785 Hyperlipidemia, unspecified: Secondary | ICD-10-CM | POA: Diagnosis not present

## 2022-05-25 DIAGNOSIS — E1129 Type 2 diabetes mellitus with other diabetic kidney complication: Secondary | ICD-10-CM | POA: Diagnosis not present

## 2022-05-25 DIAGNOSIS — R300 Dysuria: Secondary | ICD-10-CM | POA: Diagnosis not present

## 2022-06-14 DIAGNOSIS — R0689 Other abnormalities of breathing: Secondary | ICD-10-CM | POA: Diagnosis not present

## 2022-06-14 DIAGNOSIS — G4733 Obstructive sleep apnea (adult) (pediatric): Secondary | ICD-10-CM | POA: Diagnosis not present

## 2022-07-15 DIAGNOSIS — R0689 Other abnormalities of breathing: Secondary | ICD-10-CM | POA: Diagnosis not present

## 2022-07-15 DIAGNOSIS — G4733 Obstructive sleep apnea (adult) (pediatric): Secondary | ICD-10-CM | POA: Diagnosis not present

## 2022-08-15 DIAGNOSIS — R0689 Other abnormalities of breathing: Secondary | ICD-10-CM | POA: Diagnosis not present

## 2022-08-15 DIAGNOSIS — G4733 Obstructive sleep apnea (adult) (pediatric): Secondary | ICD-10-CM | POA: Diagnosis not present

## 2022-08-24 DIAGNOSIS — E1129 Type 2 diabetes mellitus with other diabetic kidney complication: Secondary | ICD-10-CM | POA: Diagnosis not present

## 2022-08-24 DIAGNOSIS — E785 Hyperlipidemia, unspecified: Secondary | ICD-10-CM | POA: Diagnosis not present

## 2022-08-24 DIAGNOSIS — D860 Sarcoidosis of lung: Secondary | ICD-10-CM | POA: Diagnosis not present

## 2022-08-24 DIAGNOSIS — G4700 Insomnia, unspecified: Secondary | ICD-10-CM | POA: Diagnosis not present

## 2022-08-24 DIAGNOSIS — R1011 Right upper quadrant pain: Secondary | ICD-10-CM | POA: Diagnosis not present

## 2022-10-14 DIAGNOSIS — R0689 Other abnormalities of breathing: Secondary | ICD-10-CM | POA: Diagnosis not present

## 2022-10-14 DIAGNOSIS — G4733 Obstructive sleep apnea (adult) (pediatric): Secondary | ICD-10-CM | POA: Diagnosis not present

## 2022-10-16 ENCOUNTER — Ambulatory Visit: Payer: 59 | Admitting: Emergency Medicine

## 2022-10-21 ENCOUNTER — Encounter: Payer: Self-pay | Admitting: Emergency Medicine

## 2022-11-13 DIAGNOSIS — R0689 Other abnormalities of breathing: Secondary | ICD-10-CM | POA: Diagnosis not present

## 2022-11-13 DIAGNOSIS — G4733 Obstructive sleep apnea (adult) (pediatric): Secondary | ICD-10-CM | POA: Diagnosis not present

## 2022-11-24 DIAGNOSIS — E785 Hyperlipidemia, unspecified: Secondary | ICD-10-CM | POA: Diagnosis not present

## 2022-11-24 DIAGNOSIS — R609 Edema, unspecified: Secondary | ICD-10-CM | POA: Diagnosis not present

## 2022-11-24 DIAGNOSIS — E1129 Type 2 diabetes mellitus with other diabetic kidney complication: Secondary | ICD-10-CM | POA: Diagnosis not present

## 2022-11-24 DIAGNOSIS — D860 Sarcoidosis of lung: Secondary | ICD-10-CM | POA: Diagnosis not present

## 2022-11-24 DIAGNOSIS — G4700 Insomnia, unspecified: Secondary | ICD-10-CM | POA: Diagnosis not present

## 2022-11-25 DIAGNOSIS — H40011 Open angle with borderline findings, low risk, right eye: Secondary | ICD-10-CM | POA: Diagnosis not present

## 2022-11-25 DIAGNOSIS — H04123 Dry eye syndrome of bilateral lacrimal glands: Secondary | ICD-10-CM | POA: Diagnosis not present

## 2022-11-25 DIAGNOSIS — H43822 Vitreomacular adhesion, left eye: Secondary | ICD-10-CM | POA: Diagnosis not present

## 2022-11-25 DIAGNOSIS — H25813 Combined forms of age-related cataract, bilateral: Secondary | ICD-10-CM | POA: Diagnosis not present

## 2022-11-25 DIAGNOSIS — E119 Type 2 diabetes mellitus without complications: Secondary | ICD-10-CM | POA: Diagnosis not present

## 2022-11-25 DIAGNOSIS — H401122 Primary open-angle glaucoma, left eye, moderate stage: Secondary | ICD-10-CM | POA: Diagnosis not present

## 2022-11-26 ENCOUNTER — Ambulatory Visit (INDEPENDENT_AMBULATORY_CARE_PROVIDER_SITE_OTHER): Payer: 59 | Admitting: Emergency Medicine

## 2022-11-26 ENCOUNTER — Encounter: Payer: Self-pay | Admitting: Emergency Medicine

## 2022-11-26 VITALS — BP 138/80 | HR 78 | Temp 98.4°F | Ht <= 58 in | Wt 234.8 lb

## 2022-11-26 DIAGNOSIS — J9611 Chronic respiratory failure with hypoxia: Secondary | ICD-10-CM

## 2022-11-26 DIAGNOSIS — G4733 Obstructive sleep apnea (adult) (pediatric): Secondary | ICD-10-CM

## 2022-11-26 DIAGNOSIS — J449 Chronic obstructive pulmonary disease, unspecified: Secondary | ICD-10-CM

## 2022-11-26 DIAGNOSIS — R935 Abnormal findings on diagnostic imaging of other abdominal regions, including retroperitoneum: Secondary | ICD-10-CM | POA: Diagnosis not present

## 2022-11-26 DIAGNOSIS — J301 Allergic rhinitis due to pollen: Secondary | ICD-10-CM

## 2022-11-26 DIAGNOSIS — R945 Abnormal results of liver function studies: Secondary | ICD-10-CM | POA: Diagnosis not present

## 2022-11-26 DIAGNOSIS — R1310 Dysphagia, unspecified: Secondary | ICD-10-CM | POA: Diagnosis not present

## 2022-11-26 DIAGNOSIS — K219 Gastro-esophageal reflux disease without esophagitis: Secondary | ICD-10-CM | POA: Diagnosis not present

## 2022-11-26 DIAGNOSIS — K760 Fatty (change of) liver, not elsewhere classified: Secondary | ICD-10-CM | POA: Diagnosis not present

## 2022-11-26 MED ORDER — STIOLTO RESPIMAT 2.5-2.5 MCG/ACT IN AERS
2.0000 | INHALATION_SPRAY | Freq: Every day | RESPIRATORY_TRACT | 3 refills | Status: DC
Start: 2022-11-26 — End: 2023-07-05

## 2022-11-26 NOTE — Patient Instructions (Signed)
We need to work on getting your portable oxygen concentrator repaired.  We will send a request to your medical equipment company.  You need to continue to wear oxygen at 3 L/min whenever you exert yourself. We need to get you back on your CPAP machine.  We will change your mask to nasal pillows.  Hopefully this will be more comfortable and you will be able to tolerate it and wear the machine reliably. Stop your Spiriva.  We will start a new medication called Stiolto.  2 puffs once daily. Follow Dr. Delton Coombes in 3 months so we can review how you are doing on the new medication and ensure that we have you back on CPAP and oxygen.

## 2022-11-26 NOTE — Assessment & Plan Note (Signed)
She has not been able to wear her exertional oxygen reliably because her POC is not functioning.  We will ask her DME to evaluate it, repair if needed.  She understands that she needs to be on 3 L/min with exertion.

## 2022-11-26 NOTE — Assessment & Plan Note (Signed)
We will try transitioning her Spiriva to Stiolto to see if she gets more benefit.

## 2022-11-26 NOTE — Progress Notes (Signed)
  Subjective:    Patient ID: Mackenzie Johnson, female    DOB: Apr 05, 1961, 62 y.o.   MRN: [id number]  HPI  ROV 11/26/2022  -- Mackenzie Johnson is 62 with history of sarcoidosis, associated COPD, obesity with OSA/OHS and chronic hypoxemic respiratory failure.  Also with chronic cough in the setting of rhinitis, GERD.  She has CPAP, tried to restart it but was unable to tolerate. She has been sleeping with 2L/min.  She is usually on supplemental oxygen but her POC is giving her trouble - every 2-3 minutes it gives a malfunction alarm. Supposed to be on 3L/min pulsed.  Currently managed on Spiriva.  She has been having more LE edema for about 7-10 days. She has exertional SOB, may have also been a bit worse. She has a HA in the am, some witnessed apneas. She is not interested in an inspire device.   CT coronary morphology 12/10/2021 reviewed by me showed mediastinal adenopathy, slightly increased compared with prior, 15 mm in the subcarinal region, left hilar node 10 mm.  There was basilar atelectasis without any significant infiltrates.    Review of Systems As per HPI     Objective:   Physical Exam Vitals:   11/26/22 1009  BP: 138/80  Pulse: 78  Temp: 98.4 F (36.9 C)  TempSrc: Oral  SpO2: 90%  Weight: 234 lb 12.8 oz (106.5 kg)  Height: 4\' 10"  (1.473 m)    Gen: Pleasant, obese, in no distress,  normal affect  ENT: No lesions,  mouth clear,  oropharynx clear, no postnasal drip  Neck: No JVD, no stridor  Lungs: No use of accessory muscles, bilateral inspiratory rhonchi.  No wheezing or crackles.  Cardiovascular: RRR, heart sounds normal, no murmur or gallops, trace ankle edema  Musculoskeletal: No deformities, no cyanosis or clubbing  Neuro: alert, non focal  Skin: Warm, no lesions or rashes      Assessment & Plan:  COPD (chronic obstructive pulmonary disease) (HCC) We will try transitioning her Spiriva to Stiolto to see if she gets more benefit.  Obstructive sleep apnea (adult)  (pediatric) With OHS.  Unfortunately she has not been able to tolerate her CPAP, has not been wearing reliably.  This is likely contributing to secondary pulmonary hypertension and her lower extremity edema.  We talked today about nasal pillows, she is willing to try this.  She is not a great candidate for oral appliance or the inspire device since she needs treatment for both hypoventilation and obstructive apnea.  Allergic rhinitis Continue same regimen  Chronic respiratory failure with hypoxia (HCC) She has not been able to wear her exertional oxygen reliably because her POC is not functioning.  We will ask her DME to evaluate it, repair if needed.  She understands that she needs to be on 3 L/min with exertion.    Levy Pupa, MD, PhD 11/26/2022, 10:31 AM [facility] [phone] or if no answer [phone]

## 2022-11-26 NOTE — Assessment & Plan Note (Signed)
Continue same regimen 

## 2022-11-26 NOTE — Assessment & Plan Note (Signed)
With OHS.  Unfortunately she has not been able to tolerate her CPAP, has not been wearing reliably.  This is likely contributing to secondary pulmonary hypertension and her lower extremity edema.  We talked today about nasal pillows, she is willing to try this.  She is not a great candidate for oral appliance or the inspire device since she needs treatment for both hypoventilation and obstructive apnea.

## 2022-12-14 DIAGNOSIS — G4733 Obstructive sleep apnea (adult) (pediatric): Secondary | ICD-10-CM | POA: Diagnosis not present

## 2022-12-14 DIAGNOSIS — R0689 Other abnormalities of breathing: Secondary | ICD-10-CM | POA: Diagnosis not present

## 2022-12-15 DIAGNOSIS — G4733 Obstructive sleep apnea (adult) (pediatric): Secondary | ICD-10-CM | POA: Diagnosis not present

## 2023-01-13 DIAGNOSIS — R0689 Other abnormalities of breathing: Secondary | ICD-10-CM | POA: Diagnosis not present

## 2023-01-13 DIAGNOSIS — G4733 Obstructive sleep apnea (adult) (pediatric): Secondary | ICD-10-CM | POA: Diagnosis not present

## 2023-01-18 DIAGNOSIS — R945 Abnormal results of liver function studies: Secondary | ICD-10-CM | POA: Diagnosis not present

## 2023-01-22 DIAGNOSIS — J069 Acute upper respiratory infection, unspecified: Secondary | ICD-10-CM | POA: Diagnosis not present

## 2023-01-22 DIAGNOSIS — J42 Unspecified chronic bronchitis: Secondary | ICD-10-CM | POA: Diagnosis not present

## 2023-01-22 DIAGNOSIS — R918 Other nonspecific abnormal finding of lung field: Secondary | ICD-10-CM | POA: Diagnosis not present

## 2023-01-22 DIAGNOSIS — I700 Atherosclerosis of aorta: Secondary | ICD-10-CM | POA: Diagnosis not present

## 2023-01-22 DIAGNOSIS — Z20822 Contact with and (suspected) exposure to covid-19: Secondary | ICD-10-CM | POA: Diagnosis not present

## 2023-01-28 DIAGNOSIS — K760 Fatty (change of) liver, not elsewhere classified: Secondary | ICD-10-CM | POA: Diagnosis not present

## 2023-01-28 DIAGNOSIS — R1310 Dysphagia, unspecified: Secondary | ICD-10-CM | POA: Diagnosis not present

## 2023-01-28 DIAGNOSIS — K219 Gastro-esophageal reflux disease without esophagitis: Secondary | ICD-10-CM | POA: Diagnosis not present

## 2023-01-29 DIAGNOSIS — R591 Generalized enlarged lymph nodes: Secondary | ICD-10-CM | POA: Diagnosis not present

## 2023-01-29 DIAGNOSIS — J479 Bronchiectasis, uncomplicated: Secondary | ICD-10-CM | POA: Diagnosis not present

## 2023-01-29 DIAGNOSIS — D860 Sarcoidosis of lung: Secondary | ICD-10-CM | POA: Diagnosis not present

## 2023-01-29 DIAGNOSIS — D869 Sarcoidosis, unspecified: Secondary | ICD-10-CM | POA: Diagnosis not present

## 2023-01-29 DIAGNOSIS — R918 Other nonspecific abnormal finding of lung field: Secondary | ICD-10-CM | POA: Diagnosis not present

## 2023-02-13 DIAGNOSIS — G4733 Obstructive sleep apnea (adult) (pediatric): Secondary | ICD-10-CM | POA: Diagnosis not present

## 2023-02-13 DIAGNOSIS — R0689 Other abnormalities of breathing: Secondary | ICD-10-CM | POA: Diagnosis not present

## 2023-02-24 DIAGNOSIS — E782 Mixed hyperlipidemia: Secondary | ICD-10-CM | POA: Diagnosis not present

## 2023-02-24 DIAGNOSIS — E1122 Type 2 diabetes mellitus with diabetic chronic kidney disease: Secondary | ICD-10-CM | POA: Diagnosis not present

## 2023-02-24 DIAGNOSIS — N1831 Chronic kidney disease, stage 3a: Secondary | ICD-10-CM | POA: Diagnosis not present

## 2023-02-24 DIAGNOSIS — K219 Gastro-esophageal reflux disease without esophagitis: Secondary | ICD-10-CM | POA: Diagnosis not present

## 2023-02-24 DIAGNOSIS — I700 Atherosclerosis of aorta: Secondary | ICD-10-CM | POA: Diagnosis not present

## 2023-02-24 DIAGNOSIS — I129 Hypertensive chronic kidney disease with stage 1 through stage 4 chronic kidney disease, or unspecified chronic kidney disease: Secondary | ICD-10-CM | POA: Diagnosis not present

## 2023-02-24 DIAGNOSIS — D860 Sarcoidosis of lung: Secondary | ICD-10-CM | POA: Diagnosis not present

## 2023-03-02 DIAGNOSIS — I10 Essential (primary) hypertension: Secondary | ICD-10-CM | POA: Diagnosis not present

## 2023-03-02 DIAGNOSIS — K648 Other hemorrhoids: Secondary | ICD-10-CM | POA: Diagnosis not present

## 2023-03-02 DIAGNOSIS — Z1211 Encounter for screening for malignant neoplasm of colon: Secondary | ICD-10-CM | POA: Diagnosis not present

## 2023-03-02 DIAGNOSIS — J449 Chronic obstructive pulmonary disease, unspecified: Secondary | ICD-10-CM | POA: Diagnosis not present

## 2023-03-02 DIAGNOSIS — D126 Benign neoplasm of colon, unspecified: Secondary | ICD-10-CM | POA: Diagnosis not present

## 2023-03-02 DIAGNOSIS — E1122 Type 2 diabetes mellitus with diabetic chronic kidney disease: Secondary | ICD-10-CM | POA: Diagnosis not present

## 2023-03-02 DIAGNOSIS — R1310 Dysphagia, unspecified: Secondary | ICD-10-CM | POA: Diagnosis not present

## 2023-03-02 DIAGNOSIS — I129 Hypertensive chronic kidney disease with stage 1 through stage 4 chronic kidney disease, or unspecified chronic kidney disease: Secondary | ICD-10-CM | POA: Diagnosis not present

## 2023-03-02 DIAGNOSIS — K269 Duodenal ulcer, unspecified as acute or chronic, without hemorrhage or perforation: Secondary | ICD-10-CM | POA: Diagnosis not present

## 2023-03-02 DIAGNOSIS — K635 Polyp of colon: Secondary | ICD-10-CM | POA: Diagnosis not present

## 2023-03-02 DIAGNOSIS — K219 Gastro-esophageal reflux disease without esophagitis: Secondary | ICD-10-CM | POA: Diagnosis not present

## 2023-03-04 DIAGNOSIS — Z1231 Encounter for screening mammogram for malignant neoplasm of breast: Secondary | ICD-10-CM | POA: Diagnosis not present

## 2023-03-12 ENCOUNTER — Telehealth: Payer: Self-pay | Admitting: Genetic Counselor

## 2023-03-12 ENCOUNTER — Other Ambulatory Visit: Payer: 59

## 2023-03-12 NOTE — Telephone Encounter (Signed)
Called patient and scheduled genetics appt 10/17 at 11am at [facility] (referral from Dr. Jennye Boroughs from polyposis).

## 2023-03-16 DIAGNOSIS — G4733 Obstructive sleep apnea (adult) (pediatric): Secondary | ICD-10-CM | POA: Diagnosis not present

## 2023-03-16 DIAGNOSIS — R0689 Other abnormalities of breathing: Secondary | ICD-10-CM | POA: Diagnosis not present

## 2023-03-17 ENCOUNTER — Telehealth: Payer: Self-pay | Admitting: Emergency Medicine

## 2023-03-17 NOTE — Telephone Encounter (Signed)
Amy from [facility] is calling to see if patient is taking both medications Spiriva and stioloto. Please call back.

## 2023-03-19 NOTE — Telephone Encounter (Signed)
Patient should only be taking Stiolto inhaler.  Called [facility] about pt's inhalers and let them know that she is only taking Stiolto. Understanding was verbalized. Nothing further needed.

## 2023-04-15 ENCOUNTER — Other Ambulatory Visit: Payer: Self-pay | Admitting: Genetic Counselor

## 2023-04-15 ENCOUNTER — Inpatient Hospital Stay: Payer: 59 | Attending: Genetic Counselor | Admitting: Genetic Counselor

## 2023-04-15 ENCOUNTER — Ambulatory Visit (INDEPENDENT_AMBULATORY_CARE_PROVIDER_SITE_OTHER): Payer: 59 | Admitting: Emergency Medicine

## 2023-04-15 ENCOUNTER — Inpatient Hospital Stay: Payer: 59

## 2023-04-15 ENCOUNTER — Encounter: Payer: Self-pay | Admitting: Emergency Medicine

## 2023-04-15 VITALS — BP 157/82 | HR 101 | Ht <= 58 in | Wt 229.2 lb

## 2023-04-15 DIAGNOSIS — J449 Chronic obstructive pulmonary disease, unspecified: Secondary | ICD-10-CM

## 2023-04-15 DIAGNOSIS — Z8042 Family history of malignant neoplasm of prostate: Secondary | ICD-10-CM | POA: Diagnosis not present

## 2023-04-15 DIAGNOSIS — Z860100 Personal history of colon polyps, unspecified: Secondary | ICD-10-CM

## 2023-04-15 DIAGNOSIS — G4733 Obstructive sleep apnea (adult) (pediatric): Secondary | ICD-10-CM | POA: Diagnosis not present

## 2023-04-15 DIAGNOSIS — J9611 Chronic respiratory failure with hypoxia: Secondary | ICD-10-CM | POA: Diagnosis not present

## 2023-04-15 DIAGNOSIS — Z806 Family history of leukemia: Secondary | ICD-10-CM | POA: Diagnosis not present

## 2023-04-15 DIAGNOSIS — Z1379 Encounter for other screening for genetic and chromosomal anomalies: Secondary | ICD-10-CM

## 2023-04-15 DIAGNOSIS — D869 Sarcoidosis, unspecified: Secondary | ICD-10-CM | POA: Diagnosis not present

## 2023-04-15 DIAGNOSIS — Z800 Family history of malignant neoplasm of digestive organs: Secondary | ICD-10-CM | POA: Diagnosis not present

## 2023-04-15 DIAGNOSIS — R0689 Other abnormalities of breathing: Secondary | ICD-10-CM | POA: Diagnosis not present

## 2023-04-15 LAB — GENETIC SCREENING ORDER

## 2023-04-15 NOTE — Assessment & Plan Note (Signed)
Congratulations on getting back on your CPAP.  Try to wear this reliably every night

## 2023-04-15 NOTE — Progress Notes (Signed)
Subjective:    Patient ID: Mackenzie Johnson, female    DOB: [birth year], 62 y.o.   MRN: [id number]  HPI: ROV 04/15/2023 --62 year old woman with a history of sarcoidosis and a associated COPD.  Also with obesity with OSA/OHS and chronic hypoxemic respiratory failure, chronic cough in the setting of rhinitis and GERD.  She has been unable to tolerate CPAP.  We have documented chronic hypoxemia but her POC was not functioning and her compliance was poor.  She has associated secondary pulmonary hypertension at our last visit we tried changing her Spiriva to Stiolto to see if she would get more benefit.  We also talked about CPAP compliance.  She was willing to try nasal pillows.  Last CT chest was 2018 Today she reports that she likes the Stiolto - her breathing seems to have benefited. She uses albuterol with heavier activity like shopping, etc. She has been using CPAP more reliably - about every other day. She still feels smothered. Her mask fitting recommended staying with the full face mask. She bleeds her O2 into her CPAP, wears during the day. She is wearing her POC a bit more reliably. She is being evaluated for possible colon polyp surgery at [facility].    Review of Systems As per HPI     Objective:   Physical Exam Vitals:   04/15/23 0852  BP: (!) 157/82  Pulse: (!) 101  SpO2: 90%  Weight: 229 lb 3.2 oz (104 kg)  Height: 4\' 9"  (1.448 m)    Gen: Pleasant, obese, in no distress,  normal affect  ENT: No lesions,  mouth clear,  oropharynx clear, no postnasal drip  Neck: No JVD, no stridor  Lungs: No use of accessory muscles, few bilateral rhonchi, no wheezes or crackles  Cardiovascular: RRR, heart sounds normal, no murmur or gallops, trace ankle edema  Musculoskeletal: No deformities, no cyanosis or clubbing  Neuro: alert, non focal  Skin: Warm, no lesions or rashes      Assessment & Plan:  Sarcoidosis We will order blood work today >> ACE Level We will repeat  your CT scan of the chest at some point in the next year.  We can talk about the timing at your next visit Follow with Dr Delton Coombes in 6 months or sooner if you have any problems  COPD (chronic obstructive pulmonary disease) (HCC) Continues Stiolto 2 puffs once daily Keep albuterol available to use 2 puffs when needed for shortness of breath, chest tightness, wheezing. Flu shot up-to-date  Obstructive sleep apnea (adult) (pediatric) Congratulations on getting back on your CPAP.  Try to wear this reliably every night   Chronic respiratory failure with hypoxia (HCC) Continue to use your oxygen at night with your CPAP and also use your portable oxygen concentrator with exertion     Levy Pupa, MD, PhD 04/15/2023, 9:26 AM [facility] [phone] or if no answer [phone]

## 2023-04-15 NOTE — Patient Instructions (Signed)
We will order blood work today We will repeat your CT scan of the chest at some point in the next year.  We can talk about the timing at your next visit Continues Stiolto 2 puffs once daily Keep albuterol available to use 2 puffs when needed for shortness of breath, chest tightness, wheezing. Flu shot up-to-date Congratulations on getting back on your CPAP.  Try to wear this reliably every night Continue to use your oxygen at night with your CPAP and also use your portable oxygen concentrator with exertion Follow with Dr Delton Coombes in 6 months or sooner if you have any problems

## 2023-04-15 NOTE — Assessment & Plan Note (Signed)
Continue to use your oxygen at night with your CPAP and also use your portable oxygen concentrator with exertion

## 2023-04-15 NOTE — Assessment & Plan Note (Signed)
Continues Stiolto 2 puffs once daily Keep albuterol available to use 2 puffs when needed for shortness of breath, chest tightness, wheezing. Flu shot up-to-date

## 2023-04-15 NOTE — Assessment & Plan Note (Signed)
We will order blood work today >> ACE Level We will repeat your CT scan of the chest at some point in the next year.  We can talk about the timing at your next visit Follow with Dr Delton Coombes in 6 months or sooner if you have any problems

## 2023-04-16 ENCOUNTER — Encounter: Payer: Self-pay | Admitting: Genetic Counselor

## 2023-04-16 LAB — ANGIOTENSIN CONVERTING ENZYME: Angiotensin-Converting Enzyme: 37 U/L (ref 9–67)

## 2023-04-16 NOTE — Progress Notes (Signed)
REFERRING PROVIDER: Doran Stabler, NP [phone] [address] [place],  [place] [address]  PRIMARY PROVIDER:  Doran Stabler, NP  PRIMARY REASON FOR VISIT:  1. Personal history of colon polyps, unspecified   2. Family history of prostate cancer   3. Family history of pancreatic cancer   4. Family history of leukemia    HISTORY OF PRESENT ILLNESS:   Ms. Flournoy, a 62 y.o. female, was seen for a [place] cancer genetics consultation at the request of Dr. Janey Greaser due to a personal history of >20 colon polyps on her recent colonoscopy.  Ms. Gillick presents to clinic today to discuss the possibility of a hereditary predisposition to cancer, to discuss genetic testing, and to further clarify her future cancer risks, as well as potential cancer risks for family members.   Ms. Gillenwater had a colonoscopy in September 2024 and >20 tubular adenomas were identified. This was her first colonoscopy.   RISK FACTORS:  Menarche was at age 58.  First live birth at age n/a.  OCP use for approximately <1 year.  Ovaries intact: yes.  Uterus intact: yes.  Menopausal status: postmenopausal, menopause at age 88  HRT use: 0 years. Colonoscopy: yes;  >20 tubular adenomas on September 2024 colonoscopy . Mammogram within the last year: yes. Number of breast biopsies: 0. Any excessive radiation exposure in the past: no  Past Medical History:  Diagnosis Date   Allergic rhinitis 11/14/2013   Anemia in CKD (chronic kidney disease)    Arthritis    Asthma    Bradycardia    Chest pain 03/12/2016   Chest pain of uncertain etiology 10/28/2021   Chronic anemia 12/02/2012   Chronic bronchitis (HCC) 02/27/2015   Chronic respiratory failure with hypoxia (HCC) 01/13/2017   CKD (chronic kidney disease), stage III (HCC) 12/02/2012   COPD (chronic obstructive pulmonary disease) (HCC)    CVA (cerebral vascular accident) (HCC) 02/17/2021   Depression    Diabetes mellitus (HCC)    Diabetes mellitus type 2 in  obese 12/02/2012   Diabetes mellitus with renal complications (HCC)    DOE (dyspnea on exertion) 04/04/2018   GERD (gastroesophageal reflux disease)    Headache    Heart murmur    Hypercholesteremia    Hyperlipemia    Hypertension    Hypothyroidism    Kidney failure    Leg pain 12/02/2012   Mixed hyperlipidemia 02/27/2015   Morbid (severe) obesity due to excess calories (HCC) 02/27/2015   CPAP   Normal coronary arteries 04/04/2018   Obesity    Obesity hypoventilation syndrome (HCC) 02/22/2013   Obstructive sleep apnea (adult) (pediatric) 02/22/2013   Renal insufficiency 02/27/2015   RLS (restless legs syndrome) 12/02/2012   Sarcoidosis     Past Surgical History:  Procedure Laterality Date   CHOLECYSTECTOMY     DILATION AND CURETTAGE OF UTERUS      Social History   Socioeconomic History   Marital status: Divorced    Spouse name: Not on file   Number of children: 0   Years of education: Not on file   Highest education level: Not on file  Occupational History   Occupation: disability    Comment: pulmonary/depression  Tobacco Use   Smoking status: Former    Current packs/day: 0.00    Average packs/day: 0.1 packs/day for 2.0 years (0.2 ttl pk-yrs)    Types: Cigarettes    Start date: 06/29/1974    Quit date: 06/29/1976    Years since quitting: 46.8   Smokeless  tobacco: Never   Tobacco comments:    as a teenager  Vaping Use   Vaping status: Never Used  Substance and Sexual Activity   Alcohol use: No   Drug use: No   Sexual activity: Not on file  Other Topics Concern   Not on file  Social History Narrative   Not on file   Social Determinants of Health   Financial Resource Strain: Not on file  Food Insecurity: Not on file  Transportation Needs: Not on file  Physical Activity: Not on file  Stress: Not on file  Social Connections: Unknown (11/10/2021)   [facility]   Social Network    Social Network: Not on file     FAMILY  HISTORY:  We obtained a detailed, 4-generation family history.  Significant diagnoses are listed below: Family History  Problem Relation Age of Onset   Kidney disease Mother    Diabetes Mother    Hypertension Mother    Hyperlipidemia Mother    Heart disease Mother    Stroke Mother    Hypercholesterolemia Mother    Hypercholesterolemia Father    Hypertension Father    Hyperlipidemia Father    Stroke Father    Prostate cancer Father 33       metastatic   Leukemia Paternal Aunt        dx. >50   Pancreatic cancer Paternal Aunt        dx. >50   Leukemia Paternal Uncle        dx. >50   Kidney disease Maternal Grandmother    Diabetes Maternal Grandmother    Hypertension Maternal Grandmother    Stroke Maternal Grandfather    Hypertension Maternal Grandfather    Arthritis Other      Ms. Orban is unaware of previous family history of genetic testing for hereditary cancer risks. There is no reported Ashkenazi Jewish ancestry.   GENETIC COUNSELING ASSESSMENT: Ms. Schwass is a 62 y.o. female with a personal history of colon polyps which is somewhat suggestive of a hereditary predisposition to cancer given >10 tubular adenomas. We, therefore, discussed and recommended the following at today's visit.   DISCUSSION: We discussed that 5 - 10% of cancer is hereditary, with most cases of polyposis associated with APC and MUTYH.  There are other genes that can be associated with hereditary polyposis/cancer syndromes.  We discussed that testing is beneficial for several reasons including knowing how to follow individuals for cancer screening and understanding if other family members could be at risk for cancer and allowing them to undergo genetic testing.   We reviewed the characteristics, features and inheritance patterns of hereditary cancer syndromes. We also discussed genetic testing, including the appropriate family members to test, the process of testing, insurance coverage and turn-around-time  for results. We discussed the implications of a negative, positive, carrier and/or variant of uncertain significant result. We recommended Ms. Barr pursue genetic testing for a panel that includes genes associated with polyposis and cancer.   Ms. Dinelli elected to have [facility] Common Cancer Panel+RNA. The Common Hereditary Cancers Panel offered by [facility] includes sequencing and/or deletion duplication testing of the following 48 genes: APC, ATM, AXIN2, BAP1, BARD1, BMPR1A, BRCA1, BRCA2, BRIP1, CDH1, CDK4, CDKN2A (p14ARF and p16INK4a only), CHEK2, CTNNA1, DICER1, EPCAM (Deletion/duplication testing only), FH, GREM1 (promoter region duplication testing only), HOXB13, KIT, MBD4, MEN1, MLH1, MSH2, MSH3, MSH6, MUTYH, NF1, NHTL1, PALB2, PDGFRA, PMS2, POLD1, POLE, PTEN, RAD51C, RAD51D, SDHA (sequencing analysis only except exon 14),  SDHB, SDHC, SDHD, SMAD4, SMARCA4. STK11, TP53, TSC1, TSC2, and VHL.  Based on Ms. Kindig's personal history of >10 tubular adenomas, she meets medical criteria for genetic testing. Despite that she meets criteria, she may still have an out of pocket cost. We discussed that if her out of pocket cost for testing is over $100, the laboratory should contact them to discuss self-pay prices, patient pay assistance programs, if applicable, and other billing options.  PLAN: After considering the risks, benefits, and limitations, Ms. Sandin provided informed consent to pursue genetic testing and the blood sample was sent to [facility] for analysis of the Common Cancer Panel. Results should be available within approximately 2-3 weeks' time, at which point they will be disclosed by telephone to Ms. Winkle, as will any additional recommendations warranted by these results. Ms. Castellini will receive a summary of her genetic counseling visit and a copy of her results once available. This information will also be available in Epic.   Ms. Matich's questions were answered to her satisfaction  today. Our contact information was provided should additional questions or concerns arise. Thank you for the referral and allowing [place] to share in the care of your patient.   Lalla Brothers, MS, [facility] Genetic Counselor [email or web address] (P) [phone]  The patient was seen for a total of 35 minutes in face-to-face genetic counseling. The patient was seen alone.  Drs. Pamelia Hoit and/or Mosetta Putt were available to discuss this case as needed.   _______________________________________________________________________ For Office Staff:  Number of people involved in session: 1 Was an Intern/ student involved with case: no

## 2023-04-21 DIAGNOSIS — Z9181 History of falling: Secondary | ICD-10-CM | POA: Diagnosis not present

## 2023-04-21 DIAGNOSIS — Z0000 Encounter for general adult medical examination without abnormal findings: Secondary | ICD-10-CM | POA: Diagnosis not present

## 2023-04-23 ENCOUNTER — Encounter: Payer: Self-pay | Admitting: Genetic Counselor

## 2023-04-23 ENCOUNTER — Telehealth: Payer: Self-pay | Admitting: Genetic Counselor

## 2023-04-23 DIAGNOSIS — Z1379 Encounter for other screening for genetic and chromosomal anomalies: Secondary | ICD-10-CM | POA: Insufficient documentation

## 2023-04-23 NOTE — Telephone Encounter (Signed)
I contacted Ms. Nakahara to discuss her genetic testing results. No pathogenic variants were identified in the 48 genes analyzed. Detailed clinic note to follow.  The test report has been scanned into EPIC and is located under the Molecular Pathology section of the Results Review tab.  A portion of the result report is included below for reference.   Lalla Brothers, MS, [facility] Genetic Counselor [email or web address] (P) [phone]

## 2023-04-26 ENCOUNTER — Ambulatory Visit: Payer: Self-pay | Admitting: Genetic Counselor

## 2023-04-26 ENCOUNTER — Encounter: Payer: Self-pay | Admitting: Genetic Counselor

## 2023-04-26 DIAGNOSIS — Z1379 Encounter for other screening for genetic and chromosomal anomalies: Secondary | ICD-10-CM

## 2023-04-26 NOTE — Progress Notes (Signed)
HPI:   Mackenzie Johnson. Ricchio was previously seen in the [facility] Genetics clinic due to a personal history of colon polyps and concerns regarding a hereditary predisposition to cancer. Please refer to our prior cancer genetics clinic note for more information regarding our discussion, assessment and recommendations, at the time. Mackenzie Johnson. Vicente's recent genetic test results were disclosed to her, as were recommendations warranted by these results. These results and recommendations are discussed in more detail below.  CANCER HISTORY:  Oncology History   No history exists.    FAMILY HISTORY:  We obtained a detailed, 4-generation family history.  Significant diagnoses are listed below:      Family History  Problem Relation Age of Onset   Kidney disease Mother     Diabetes Mother     Hypertension Mother     Hyperlipidemia Mother     Heart disease Mother     Stroke Mother     Hypercholesterolemia Mother     Hypercholesterolemia Father     Hypertension Father     Hyperlipidemia Father     Stroke Father     Prostate cancer Father 48        metastatic   Leukemia Paternal Aunt          dx. >50   Pancreatic cancer Paternal Aunt          dx. >50   Leukemia Paternal Uncle          dx. >50   Kidney disease Maternal Grandmother     Diabetes Maternal Grandmother     Hypertension Maternal Grandmother     Stroke Maternal Grandfather     Hypertension Maternal Grandfather     Arthritis Other             Mackenzie Johnson. Hameister is unaware of previous family history of genetic testing for hereditary cancer risks. There is no reported Ashkenazi Jewish ancestry.    GENETIC TEST RESULTS:  The [facility] Common Cancer Panel found no pathogenic mutations.   The Common Hereditary Cancers Panel offered by [facility] includes sequencing and/or deletion duplication testing of the following 48 genes: APC, ATM, AXIN2, BAP1, BARD1, BMPR1A, BRCA1, BRCA2, BRIP1, CDH1, CDK4, CDKN2A (p14ARF and p16INK4a only), CHEK2, CTNNA1,  DICER1, EPCAM (Deletion/duplication testing only), FH, GREM1 (promoter region duplication testing only), HOXB13, KIT, MBD4, MEN1, MLH1, MSH2, MSH3, MSH6, MUTYH, NF1, NHTL1, PALB2, PDGFRA, PMS2, POLD1, POLE, PTEN, RAD51C, RAD51D, SDHA (sequencing analysis only except exon 14), SDHB, SDHC, SDHD, SMAD4, SMARCA4. STK11, TP53, TSC1, TSC2, and VHL.   The test report has been scanned into EPIC and is located under the Molecular Pathology section of the Results Review tab.  A portion of the result report is included below for reference. Genetic testing reported out on 04/21/2023.     Even though a pathogenic variant was not identified, possible explanations for her polyposis/the cancer in the family may include: There may be no hereditary risk for polyposis/cancer in the family. Her polyposis and the cancers in her family may be due to other genetic or environmental factors. There may be a gene mutation in one of these genes that current testing methods cannot detect, but that chance is small. There could be another gene that has not yet been discovered, or that we have not yet tested, that is responsible for her polyposis/the cancer diagnoses in the family.   ADDITIONAL GENETIC TESTING:  We discussed with Mackenzie Johnson. Kettell that her genetic testing was fairly extensive.  If there are genes identified to  increase polyposis/cancer risk that can be analyzed in the future, we would be happy to discuss and coordinate this testing at that time.    CANCER SCREENING RECOMMENDATIONS:  Mackenzie Johnson. Kimbrough test result is considered negative (normal).  This means that we have not identified a hereditary cause for her personal history of colon polyps at this time.   An individual's cancer risk and medical management are not determined by genetic test results alone. Overall cancer risk assessment incorporates additional factors, including personal medical history, family history, and any available genetic information that may result  in a personalized plan for cancer prevention and surveillance. Therefore, it is recommended she continue to follow the cancer management and screening guidelines provided by her healthcare providers.  FOLLOW-UP:  Cancer genetics is a rapidly advancing field and it is possible that new genetic tests will be appropriate for her and/or her family members in the future. We encouraged her to remain in contact with cancer genetics on an annual basis so we can update her personal and family histories and let her know of advances in cancer genetics that may benefit this family.   Our contact number was provided. Mackenzie Johnson. Petrizzo's questions were answered to her satisfaction, and she knows she is welcome to call us at anytime with additional questions or concerns.   Mackenzie Brothers, Mackenzie Johnson, [facility] Genetic Counselor [email or web address] (P) [phone]

## 2023-05-16 DIAGNOSIS — R0689 Other abnormalities of breathing: Secondary | ICD-10-CM | POA: Diagnosis not present

## 2023-05-16 DIAGNOSIS — G4733 Obstructive sleep apnea (adult) (pediatric): Secondary | ICD-10-CM | POA: Diagnosis not present

## 2023-05-25 DIAGNOSIS — D860 Sarcoidosis of lung: Secondary | ICD-10-CM | POA: Diagnosis not present

## 2023-05-25 DIAGNOSIS — E782 Mixed hyperlipidemia: Secondary | ICD-10-CM | POA: Diagnosis not present

## 2023-05-25 DIAGNOSIS — N1831 Chronic kidney disease, stage 3a: Secondary | ICD-10-CM | POA: Diagnosis not present

## 2023-05-25 DIAGNOSIS — J449 Chronic obstructive pulmonary disease, unspecified: Secondary | ICD-10-CM | POA: Diagnosis not present

## 2023-05-25 DIAGNOSIS — E1122 Type 2 diabetes mellitus with diabetic chronic kidney disease: Secondary | ICD-10-CM | POA: Diagnosis not present

## 2023-05-25 DIAGNOSIS — I129 Hypertensive chronic kidney disease with stage 1 through stage 4 chronic kidney disease, or unspecified chronic kidney disease: Secondary | ICD-10-CM | POA: Diagnosis not present

## 2023-05-25 DIAGNOSIS — K219 Gastro-esophageal reflux disease without esophagitis: Secondary | ICD-10-CM | POA: Diagnosis not present

## 2023-05-25 DIAGNOSIS — I700 Atherosclerosis of aorta: Secondary | ICD-10-CM | POA: Diagnosis not present

## 2023-06-09 DIAGNOSIS — H40011 Open angle with borderline findings, low risk, right eye: Secondary | ICD-10-CM | POA: Diagnosis not present

## 2023-06-09 DIAGNOSIS — H401122 Primary open-angle glaucoma, left eye, moderate stage: Secondary | ICD-10-CM | POA: Diagnosis not present

## 2023-06-15 DIAGNOSIS — R0689 Other abnormalities of breathing: Secondary | ICD-10-CM | POA: Diagnosis not present

## 2023-06-15 DIAGNOSIS — G4733 Obstructive sleep apnea (adult) (pediatric): Secondary | ICD-10-CM | POA: Diagnosis not present

## 2023-06-16 DIAGNOSIS — K635 Polyp of colon: Secondary | ICD-10-CM | POA: Diagnosis not present

## 2023-06-16 DIAGNOSIS — K760 Fatty (change of) liver, not elsewhere classified: Secondary | ICD-10-CM | POA: Diagnosis not present

## 2023-06-16 DIAGNOSIS — K219 Gastro-esophageal reflux disease without esophagitis: Secondary | ICD-10-CM | POA: Diagnosis not present

## 2023-06-16 DIAGNOSIS — R1310 Dysphagia, unspecified: Secondary | ICD-10-CM | POA: Diagnosis not present

## 2023-07-05 ENCOUNTER — Other Ambulatory Visit: Payer: Self-pay | Admitting: Emergency Medicine

## 2023-07-06 DIAGNOSIS — I2510 Atherosclerotic heart disease of native coronary artery without angina pectoris: Secondary | ICD-10-CM | POA: Diagnosis not present

## 2023-07-06 DIAGNOSIS — D860 Sarcoidosis of lung: Secondary | ICD-10-CM | POA: Diagnosis not present

## 2023-07-06 DIAGNOSIS — J4489 Other specified chronic obstructive pulmonary disease: Secondary | ICD-10-CM | POA: Diagnosis not present

## 2023-07-06 DIAGNOSIS — E119 Type 2 diabetes mellitus without complications: Secondary | ICD-10-CM | POA: Diagnosis not present

## 2023-07-06 DIAGNOSIS — D124 Benign neoplasm of descending colon: Secondary | ICD-10-CM | POA: Diagnosis not present

## 2023-07-16 DIAGNOSIS — G4733 Obstructive sleep apnea (adult) (pediatric): Secondary | ICD-10-CM | POA: Diagnosis not present

## 2023-07-16 DIAGNOSIS — R0689 Other abnormalities of breathing: Secondary | ICD-10-CM | POA: Diagnosis not present

## 2023-08-16 ENCOUNTER — Telehealth: Payer: Self-pay

## 2023-08-16 DIAGNOSIS — R0689 Other abnormalities of breathing: Secondary | ICD-10-CM | POA: Diagnosis not present

## 2023-08-16 DIAGNOSIS — G4733 Obstructive sleep apnea (adult) (pediatric): Secondary | ICD-10-CM | POA: Diagnosis not present

## 2023-08-16 NOTE — Telephone Encounter (Signed)
Fax received from Dr. Hardin Negus with [facility]  to perform a robot assisted left colon resection on patient.  Patient needs surgery clearance. Surgery is pending. Patient was seen on 04/15/23. Office protocol is a risk assessment can be sent to surgeon if patient has been seen in 60 days or less.   Pt has appt with Dr Delton Coombes for risk assessment to be done 08/19/23  Will hold in clearance pool

## 2023-08-19 ENCOUNTER — Encounter: Payer: Self-pay | Admitting: Emergency Medicine

## 2023-08-19 ENCOUNTER — Ambulatory Visit: Payer: 59 | Admitting: Emergency Medicine

## 2023-08-19 VITALS — BP 130/85 | HR 113 | Ht <= 58 in | Wt 210.2 lb

## 2023-08-19 DIAGNOSIS — J449 Chronic obstructive pulmonary disease, unspecified: Secondary | ICD-10-CM | POA: Diagnosis not present

## 2023-08-19 DIAGNOSIS — D869 Sarcoidosis, unspecified: Secondary | ICD-10-CM | POA: Diagnosis not present

## 2023-08-19 DIAGNOSIS — K635 Polyp of colon: Secondary | ICD-10-CM | POA: Diagnosis not present

## 2023-08-19 DIAGNOSIS — J9611 Chronic respiratory failure with hypoxia: Secondary | ICD-10-CM | POA: Diagnosis not present

## 2023-08-19 DIAGNOSIS — G4733 Obstructive sleep apnea (adult) (pediatric): Secondary | ICD-10-CM | POA: Diagnosis not present

## 2023-08-19 NOTE — Assessment & Plan Note (Signed)
OSA with OHS.  Needs to be back on CPAP reliably.  1 barrier appears to be mask fit and we will work with Mackenzie Johnson try to resolve.  Her device is over 65 years old and need to be replaced we will order this as well.  Unclear whether she will need a repeat home sleep study in order to get it.  We will also confirm her current CPAP pressure settings.  If she is feeling smothered then she may need to be on an auto titration mode.  Need to confirm how her current orders read.

## 2023-08-19 NOTE — Progress Notes (Signed)
Subjective:    Patient ID: Mackenzie Johnson, female    DOB: 1960/10/23, 63 y.o.   MRN: [id number]  HPI: ROV 04/15/2023 --63 year old woman with a history of sarcoidosis and a associated COPD.  Also with obesity with OSA/OHS and chronic hypoxemic respiratory failure, chronic cough in the setting of rhinitis and GERD.  She has been unable to tolerate CPAP.  We have documented chronic hypoxemia but her POC was not functioning and her compliance was poor.  She has associated secondary pulmonary hypertension at our last visit we tried changing her Spiriva to Stiolto to see if she would get more benefit.  We also talked about CPAP compliance.  She was willing to try nasal pillows.  Last CT chest was 2018 Today she reports that she likes the Stiolto - her breathing seems to have benefited. She uses albuterol with heavier activity like shopping, etc. She has been using CPAP more reliably - about every other day. She still feels smothered. Her mask fitting recommended staying with the full face mask. She bleeds her O2 into her CPAP, wears during the day. She is wearing her POC a bit more reliably. She is being evaluated for possible colon polyp surgery at [facility].   ROV 08/19/2023 --Mackenzie Johnson is 63 and has a history of sarcoidosis, associated obstructive lung disease, OSA/OHS with associated chronic hypoxemic respiratory failure.  We have ordered CPAP but she has used only intermittently.  Has been managed on Stiolto.  Has albuterol, uses approximately few times a week. She uses POC at 3-4L/min reliably.  She is under evaluation for colonic polyposis and is being considered for partial colectomy in [place].   Today she reports that she has not been wearing her CPAP reliably - has not worn for over a month. She feels that the mask (full face mask) smothers her, and also has a lot of leakage. She feels weaker, more lethargic, has more SOB off the CPAP. She has had the machine for over 10 yrs. Her  company is [employer]. She has lost about 30 lbs in the last yr.   CT chest [facility] 02/04/2023 reviewed by me showed scattered bilateral groundglass changes consistent with her sarcoidosis, possibly slightly progressed compared with prior.  Stable basilar bronchiectatic change.  Stable mediastinal and hilar adenopathy.  Evidence for pulmonary hypertension    Review of Systems As per HPI     Objective:   Physical Exam Vitals:   08/19/23 1318  BP: 130/85  Pulse: (!) 113  SpO2: 95%  Weight: 210 lb 3.2 oz (95.3 kg)  Height: 4\' 9"  (1.448 m)     Gen: Pleasant, obese, in no distress,  normal affect  ENT: No lesions,  mouth clear,  oropharynx clear, no postnasal drip  Neck: No JVD, no stridor  Lungs: No use of accessory muscles, bibasilar inspiratory crackles.  No wheezes  Cardiovascular: RRR, heart sounds normal, no murmur or gallops, trace ankle edema  Musculoskeletal: No deformities, no cyanosis or clubbing  Neuro: alert, non focal  Skin: Warm, no lesions or rashes      Assessment & Plan:  COPD (chronic obstructive pulmonary disease) (HCC) Reliable with her Stiolto and uses albuterol approximately few times a week.  No flares.  Stable but certainly limiting.  Plan continue same regimen  Obstructive sleep apnea (adult) (pediatric) OSA with OHS.  Needs to be back on CPAP reliably.  1 barrier appears to be mask fit and we will work with [name] try to resolve.  Her device is over 65 years old and need to be replaced we will order this as well.  Unclear whether she will need a repeat home sleep study in order to get it.  We will also confirm her current CPAP pressure settings.  If she is feeling smothered then she may need to be on an auto titration mode.  Need to confirm how her current orders read.  Chronic respiratory failure with hypoxia (HCC) Continue her oxygen at 3-4 L/min.  Also needs to be bled in at 3 L/min into her CPAP at night  Sarcoidosis Consider repeat pulmonary  function testing, likely needs a repeat CT chest at the 1 year mark which would be August 2025.  Colon polyps She is under evaluation for possible bowel resection.  Explained to her that she is at high risk for surgery due to her multiple medical problems including her chronic hypoxemic respiratory failure with OSA/OHS.  Certainly this would need to be optimized (back on CPAP) before she should have an elective surgery.  I did explain that her risk factors do not preclude surgery as long as the benefits outweigh these risks.   Time spent 41 minutes   Levy Pupa, MD, PhD 08/19/2023, 1:41 PM [facility] [phone] or if no answer [phone]

## 2023-08-19 NOTE — Assessment & Plan Note (Signed)
She is under evaluation for possible bowel resection.  Explained to her that she is at high risk for surgery due to her multiple medical problems including her chronic hypoxemic respiratory failure with OSA/OHS.  Certainly this would need to be optimized (back on CPAP) before she should have an elective surgery.  I did explain that her risk factors do not preclude surgery as long as the benefits outweigh these risks.

## 2023-08-19 NOTE — Assessment & Plan Note (Signed)
Reliable with her Stiolto and uses albuterol approximately few times a week.  No flares.  Stable but certainly limiting.  Plan continue same regimen

## 2023-08-19 NOTE — Assessment & Plan Note (Signed)
Continue her oxygen at 3-4 L/min.  Also needs to be bled in at 3 L/min into her CPAP at night

## 2023-08-19 NOTE — Assessment & Plan Note (Signed)
Consider repeat pulmonary function testing, likely needs a repeat CT chest at the 1 year mark which would be August 2025.

## 2023-08-19 NOTE — Patient Instructions (Signed)
We need to get you back on your CPAP every night reliably.  Optimally you would be using your CPAP consistently before having an elective surgery.  We will send an order to [employer] to help get you a better fitting mask.  You are also due to have a new CPAP machine and we will order this as well. Please continue your oxygen at 3-4 L/min as you have been using it Continue your Stiolto 2 puffs once daily. Keep your albuterol available to use 2 puffs when needed for shortness of breath, chest tightness, wheezing. You are at high risk for general anesthesia and surgery due to your CPAP use, sarcoidosis, COPD.  This does not preclude you from having surgery but need to account for the risks and the benefits.  Optimally you will be back on scheduled CPAP treatment before surgery.  You will probably also need to have CPAP available during the postoperative period as you recover. Follow with APP in 2 months Follow with Dr. Delton Coombes in 6 months, sooner if any problems.

## 2023-09-01 NOTE — Telephone Encounter (Signed)
 I have faxed a copy of ov note with Dr Delton Coombes from 08/19/23 to Dr Logan Bores  Nothing further needed

## 2023-09-02 DIAGNOSIS — J449 Chronic obstructive pulmonary disease, unspecified: Secondary | ICD-10-CM | POA: Diagnosis not present

## 2023-09-02 DIAGNOSIS — R300 Dysuria: Secondary | ICD-10-CM | POA: Diagnosis not present

## 2023-09-02 DIAGNOSIS — D860 Sarcoidosis of lung: Secondary | ICD-10-CM | POA: Diagnosis not present

## 2023-09-02 DIAGNOSIS — I700 Atherosclerosis of aorta: Secondary | ICD-10-CM | POA: Diagnosis not present

## 2023-09-02 DIAGNOSIS — E782 Mixed hyperlipidemia: Secondary | ICD-10-CM | POA: Diagnosis not present

## 2023-09-02 DIAGNOSIS — K219 Gastro-esophageal reflux disease without esophagitis: Secondary | ICD-10-CM | POA: Diagnosis not present

## 2023-09-02 DIAGNOSIS — N3000 Acute cystitis without hematuria: Secondary | ICD-10-CM | POA: Diagnosis not present

## 2023-09-02 DIAGNOSIS — I129 Hypertensive chronic kidney disease with stage 1 through stage 4 chronic kidney disease, or unspecified chronic kidney disease: Secondary | ICD-10-CM | POA: Diagnosis not present

## 2023-09-02 DIAGNOSIS — E1122 Type 2 diabetes mellitus with diabetic chronic kidney disease: Secondary | ICD-10-CM | POA: Diagnosis not present

## 2023-09-02 DIAGNOSIS — N1831 Chronic kidney disease, stage 3a: Secondary | ICD-10-CM | POA: Diagnosis not present

## 2023-09-13 DIAGNOSIS — G4733 Obstructive sleep apnea (adult) (pediatric): Secondary | ICD-10-CM | POA: Diagnosis not present

## 2023-09-13 DIAGNOSIS — R0689 Other abnormalities of breathing: Secondary | ICD-10-CM | POA: Diagnosis not present

## 2023-09-27 ENCOUNTER — Telehealth: Payer: Self-pay

## 2023-09-27 DIAGNOSIS — G4733 Obstructive sleep apnea (adult) (pediatric): Secondary | ICD-10-CM

## 2023-09-27 NOTE — Telephone Encounter (Signed)
 Copied from CRM [id number]. Topic: Clinical - Medication Question >> Sep 27, 2023 10:57 AM Orinda Kenner C wrote: Reason for CRM: Patient [phone] is requesting for a Dr. Inis Sizer prescribed a new cpap machine, her cpap machine has stopped working, won't turned on. Patient has appt with surgeon October 06, 2023 to have schedule surgery removing large intestine. Please advise and call back.   Pt would like new cpap machine. Please advise Dr. Delton Coombes if ok to place order.

## 2023-09-28 NOTE — Telephone Encounter (Signed)
 Yes ok to order based on her most recent PSG. PLease continue the same pressure settings she was on before

## 2023-09-28 NOTE — Telephone Encounter (Signed)
 Yes > auto-set 10-20cm H2O, heated humidity, best fit mask of choice

## 2023-09-28 NOTE — Telephone Encounter (Signed)
 ATC x1. LDVM regarding new cpap order has been sent to [employer]. Sending Mychart message. Cpap order has been placed. Nothing further needed.

## 2023-09-28 NOTE — Telephone Encounter (Signed)
 Spoke with [employer] regarding cpap order placed on 08/19/23 stating pt needs new cpap machine & supplies. Informed me order wouldn't work since settings werent specified. I will place new dme order. Per [employer] her settings are 10-20cm H20 since 2018. Double checking order setting is correct before placing order. Please advise Dr. Delton Coombes

## 2023-09-30 ENCOUNTER — Encounter: Payer: Self-pay | Admitting: Emergency Medicine

## 2023-09-30 ENCOUNTER — Ambulatory Visit: Payer: 59 | Admitting: Emergency Medicine

## 2023-09-30 VITALS — BP 116/66 | HR 94 | Ht <= 58 in | Wt 205.8 lb

## 2023-09-30 DIAGNOSIS — J9611 Chronic respiratory failure with hypoxia: Secondary | ICD-10-CM

## 2023-09-30 DIAGNOSIS — K635 Polyp of colon: Secondary | ICD-10-CM | POA: Diagnosis not present

## 2023-09-30 DIAGNOSIS — G4733 Obstructive sleep apnea (adult) (pediatric): Secondary | ICD-10-CM

## 2023-09-30 DIAGNOSIS — J449 Chronic obstructive pulmonary disease, unspecified: Secondary | ICD-10-CM

## 2023-09-30 DIAGNOSIS — D869 Sarcoidosis, unspecified: Secondary | ICD-10-CM

## 2023-09-30 NOTE — Progress Notes (Signed)
 Subjective:    Patient ID: Mackenzie Johnson, female    DOB: 02-11-1961, 63 y.o.   MRN: [id number]  HPI:  ROV 08/19/2023 --Mackenzie Johnson is 63 and has a history of sarcoidosis, associated obstructive lung disease, OSA/OHS with associated chronic hypoxemic respiratory failure.  We have ordered CPAP but she has used only intermittently.  Has been managed on Stiolto.  Has albuterol, uses approximately few times a week. She uses POC at 3-4L/min reliably.  She is under evaluation for colonic polyposis and is being considered for partial colectomy in [place].   Today she reports that she has not been wearing her CPAP reliably - has not worn for over a month. She feels that the mask (full face mask) smothers her, and also has a lot of leakage. She feels weaker, more lethargic, has more SOB off the CPAP. She has had the machine for over 10 yrs. Her company is [employer]. She has lost about 30 lbs in the last yr.   CT chest [facility] 02/04/2023 reviewed by me showed scattered bilateral groundglass changes consistent with her sarcoidosis, possibly slightly progressed compared with prior.  Stable basilar bronchiectatic change.  Stable mediastinal and hilar adenopathy.  Evidence for pulmonary hypertension  ROV 09/30/2023 --follow-up visit for 63 year old woman with chronic hypoxemic respiratory failure in the setting of sarcoidosis and obstructive lung disease, obesity with OSA/OHS.  She has only been intermittently reliable with her CPAP, and now the device is broken completely so she cannot use.  We did order her a new device.  She is using oxygen at 3-4 L/min.  Currently managed on Stiolto.  She uses albuterol w exertion - when shopping. She is pretty good about wearing her O2, sometimes forgets it, or forgets to charge the POC. 3L/min. Some cough, minimally productive. No flares, abx, pred since I have seen her.  She has been under evaluation for possible colectomy for colonic polyposis, reports that she is still working  on this w surgery, has not been scheduled.  Due for a repeat CT scan of her chest in August 2025 to follow her sarcoidosis   Review of Systems As per HPI     Objective:   Physical Exam Vitals:   09/30/23 0925  BP: 116/66  Pulse: 94  SpO2: 96%  Weight: 205 lb 12.8 oz (93.4 kg)  Height: 4\' 9"  (1.448 m)     Gen: Pleasant, obese, in no distress,  normal affect  ENT: No lesions,  mouth clear,  oropharynx clear, no postnasal drip  Neck: No JVD, no stridor  Lungs: No use of accessory muscles, bibasilar inspiratory crackles.  No wheezes  Cardiovascular: RRR, heart sounds normal, no murmur or gallops, trace ankle edema  Musculoskeletal: No deformities, no cyanosis or clubbing  Neuro: alert, non focal  Skin: Warm, no lesions or rashes      Assessment & Plan:  Chronic respiratory failure with hypoxia (HCC) Continue to wear your oxygen at 3-4 l/min depending on your level of exertion using your portable oxygen concentrator  Obstructive sleep apnea (adult) (pediatric) We will work on confirming that your new CPAP machine is ordered and that you will receive.  COPD (chronic obstructive pulmonary disease) (HCC) Please continue Stiolto 2 puffs once daily. Keep albuterol available to use 2 puffs up to every 4 hours if needed for shortness of breath, chest tightness, wheezing.   Sarcoidosis We will plan to repeat your CT scan of the chest in August 2025 to follow your sarcoidosis  Colon polyps  Being considered for surgical resection for a large polyp that is high risk for malignancy.  She is working on getting this arranged.  As discussed in her last office visit she is high risk for surgery due to her COPD, OSA/OHS.  Nothing to preclude proceeding.  She will likely need CPAP/BiPAP in the perioperative wake up period.        Levy Pupa, MD, PhD 09/30/2023, 9:43 AM [facility] [phone] or if no answer [phone]

## 2023-09-30 NOTE — Assessment & Plan Note (Signed)
Please continue Stiolto 2 puffs once daily. Keep albuterol available to use 2 puffs up to every 4 hours if needed for shortness of breath, chest tightness, wheezing.

## 2023-09-30 NOTE — Assessment & Plan Note (Signed)
 Being considered for surgical resection for a large polyp that is high risk for malignancy.  She is working on getting this arranged.  As discussed in her last office visit she is high risk for surgery due to her COPD, OSA/OHS.  Nothing to preclude proceeding.  She will likely need CPAP/BiPAP in the perioperative wake up period.

## 2023-09-30 NOTE — Assessment & Plan Note (Signed)
 Continue to wear your oxygen at 3-4 l/min depending on your level of exertion using your portable oxygen concentrator

## 2023-09-30 NOTE — Patient Instructions (Addendum)
 Continue to wear your oxygen at 3-4 l/min depending on your level of exertion using your portable oxygen concentrator We will work on confirming that your new CPAP machine is ordered and that you will receive. Please continue Stiolto 2 puffs once daily. Keep albuterol available to use 2 puffs up to every 4 hours if needed for shortness of breath, chest tightness, wheezing.  We will plan to repeat your CT scan of the chest in August 2025 to follow your sarcoidosis

## 2023-09-30 NOTE — Assessment & Plan Note (Signed)
 We will plan to repeat your CT scan of the chest in August 2025 to follow your sarcoidosis

## 2023-09-30 NOTE — Assessment & Plan Note (Signed)
 We will work on confirming that your new CPAP machine is ordered and that you will receive.

## 2023-10-06 DIAGNOSIS — I2510 Atherosclerotic heart disease of native coronary artery without angina pectoris: Secondary | ICD-10-CM | POA: Diagnosis not present

## 2023-10-06 DIAGNOSIS — E119 Type 2 diabetes mellitus without complications: Secondary | ICD-10-CM | POA: Diagnosis not present

## 2023-10-06 DIAGNOSIS — J4489 Other specified chronic obstructive pulmonary disease: Secondary | ICD-10-CM | POA: Diagnosis not present

## 2023-10-06 DIAGNOSIS — D860 Sarcoidosis of lung: Secondary | ICD-10-CM | POA: Diagnosis not present

## 2023-10-06 DIAGNOSIS — D124 Benign neoplasm of descending colon: Secondary | ICD-10-CM | POA: Diagnosis not present

## 2023-10-07 DIAGNOSIS — G4733 Obstructive sleep apnea (adult) (pediatric): Secondary | ICD-10-CM | POA: Diagnosis not present

## 2023-10-14 ENCOUNTER — Telehealth: Payer: Self-pay

## 2023-10-14 DIAGNOSIS — G4733 Obstructive sleep apnea (adult) (pediatric): Secondary | ICD-10-CM | POA: Diagnosis not present

## 2023-10-14 DIAGNOSIS — R0689 Other abnormalities of breathing: Secondary | ICD-10-CM | POA: Diagnosis not present

## 2023-10-20 DIAGNOSIS — E079 Disorder of thyroid, unspecified: Secondary | ICD-10-CM | POA: Diagnosis not present

## 2023-10-25 DIAGNOSIS — I452 Bifascicular block: Secondary | ICD-10-CM | POA: Diagnosis not present

## 2023-10-25 DIAGNOSIS — K219 Gastro-esophageal reflux disease without esophagitis: Secondary | ICD-10-CM | POA: Diagnosis not present

## 2023-10-25 DIAGNOSIS — Z7985 Long-term (current) use of injectable non-insulin antidiabetic drugs: Secondary | ICD-10-CM | POA: Diagnosis not present

## 2023-10-25 DIAGNOSIS — E7800 Pure hypercholesterolemia, unspecified: Secondary | ICD-10-CM | POA: Diagnosis not present

## 2023-10-25 DIAGNOSIS — D860 Sarcoidosis of lung: Secondary | ICD-10-CM | POA: Diagnosis not present

## 2023-10-25 DIAGNOSIS — J9611 Chronic respiratory failure with hypoxia: Secondary | ICD-10-CM | POA: Diagnosis not present

## 2023-10-25 DIAGNOSIS — I129 Hypertensive chronic kidney disease with stage 1 through stage 4 chronic kidney disease, or unspecified chronic kidney disease: Secondary | ICD-10-CM | POA: Diagnosis not present

## 2023-10-25 DIAGNOSIS — E039 Hypothyroidism, unspecified: Secondary | ICD-10-CM | POA: Diagnosis not present

## 2023-10-25 DIAGNOSIS — D124 Benign neoplasm of descending colon: Secondary | ICD-10-CM | POA: Diagnosis not present

## 2023-10-25 DIAGNOSIS — N183 Chronic kidney disease, stage 3 unspecified: Secondary | ICD-10-CM | POA: Diagnosis not present

## 2023-10-25 DIAGNOSIS — K430 Incisional hernia with obstruction, without gangrene: Secondary | ICD-10-CM | POA: Diagnosis not present

## 2023-10-25 DIAGNOSIS — G8918 Other acute postprocedural pain: Secondary | ICD-10-CM | POA: Diagnosis not present

## 2023-10-25 DIAGNOSIS — I2510 Atherosclerotic heart disease of native coronary artery without angina pectoris: Secondary | ICD-10-CM | POA: Diagnosis not present

## 2023-10-25 DIAGNOSIS — Z9049 Acquired absence of other specified parts of digestive tract: Secondary | ICD-10-CM | POA: Diagnosis not present

## 2023-10-25 DIAGNOSIS — E1122 Type 2 diabetes mellitus with diabetic chronic kidney disease: Secondary | ICD-10-CM | POA: Diagnosis not present

## 2023-10-25 DIAGNOSIS — K432 Incisional hernia without obstruction or gangrene: Secondary | ICD-10-CM | POA: Diagnosis not present

## 2023-10-25 DIAGNOSIS — K660 Peritoneal adhesions (postprocedural) (postinfection): Secondary | ICD-10-CM | POA: Diagnosis not present

## 2023-10-25 DIAGNOSIS — Z9981 Dependence on supplemental oxygen: Secondary | ICD-10-CM | POA: Diagnosis not present

## 2023-10-25 DIAGNOSIS — Z87891 Personal history of nicotine dependence: Secondary | ICD-10-CM | POA: Diagnosis not present

## 2023-10-25 DIAGNOSIS — D123 Benign neoplasm of transverse colon: Secondary | ICD-10-CM | POA: Diagnosis not present

## 2023-11-01 NOTE — Telephone Encounter (Signed)
 Copied from CRM [id number]. Topic: Appointments - Appointment Scheduling >> Oct 29, 2023  3:09 PM Mackenzie Johnson B wrote: Patient would like a call from [place] regarding her CPAP. Please call.  ATC x2. Unable to leave voicemail

## 2023-11-05 ENCOUNTER — Encounter: Payer: Self-pay | Admitting: Emergency Medicine

## 2023-11-05 ENCOUNTER — Ambulatory Visit: Admitting: Emergency Medicine

## 2023-11-05 VITALS — BP 111/74 | HR 81 | Ht <= 58 in | Wt 196.4 lb

## 2023-11-05 DIAGNOSIS — D869 Sarcoidosis, unspecified: Secondary | ICD-10-CM | POA: Diagnosis not present

## 2023-11-05 DIAGNOSIS — J9611 Chronic respiratory failure with hypoxia: Secondary | ICD-10-CM

## 2023-11-05 DIAGNOSIS — J449 Chronic obstructive pulmonary disease, unspecified: Secondary | ICD-10-CM

## 2023-11-05 DIAGNOSIS — G4733 Obstructive sleep apnea (adult) (pediatric): Secondary | ICD-10-CM

## 2023-11-05 NOTE — Assessment & Plan Note (Signed)
 Continue Stiolto 2 puffs once daily Keep albuterol  available use 2 puffs if needed for shortness of breath, chest tightness, wheezing.

## 2023-11-05 NOTE — Progress Notes (Signed)
 Subjective:    Patient ID: Mackenzie Johnson, female    DOB: 1960-10-12, 63 y.o.   MRN: [id number]  HPI:  ROV 09/30/2023 --follow-up visit for 63 year old woman with chronic hypoxemic respiratory failure in the setting of sarcoidosis and obstructive lung disease, obesity with OSA/OHS.  She has only been intermittently reliable with her CPAP, and now the device is broken completely so she cannot use.  We did order her a new device.  She is using oxygen  at 3-4 L/min.  Currently managed on Stiolto.  She uses albuterol  w exertion - when shopping. She is pretty good about wearing her O2, sometimes forgets it, or forgets to charge the POC. 3L/min. Some cough, minimally productive. No flares, abx, pred since I have seen her.  She has been under evaluation for possible colectomy for colonic polyposis, reports that she is still working on this w surgery, has not been scheduled.  Due for a repeat CT scan of her chest in August 2025 to follow her sarcoidosis  ROV 11/05/2023 --Mackenzie Johnson is 18 and has a history of sarcoidosis, associated COPD, obesity with OSA/OHS.  She has chronic hypoxemic respiratory failure due to all the above.  She underwent surgical resection of a large colonic polyp 10/25/2023.  She had previously been quite reliable with her CPAP but then had trouble with her device which impacted her compliance.  When I saw her in April we ordered the new device and confirmed that she would receive.  She reports today that she was able to get her CPAP machine. She is using it. She has a small full face mask. She does deal w some leakage, machine noise. She sometimes removes due to noise and leakage, feels that she wil be able to wear better when she is able to get back to sleeping on her side.  Compliance download available for/9/25 through 11/04/2023 shows 90% compliance, 60% of the time for greater than 4 hours.  She is on AutoSet 10-20 cm water.  She reports good clinical benefit.   Review of Systems As per  HPI     Objective:   Physical Exam Vitals:   11/05/23 1008  BP: 111/74  Pulse: 81  SpO2: 98%  Weight: 196 lb 6.4 oz (89.1 kg)  Height: 4\' 9"  (1.448 m)    Gen: Pleasant, obese, in no distress,  normal affect  ENT: No lesions,  mouth clear,  oropharynx clear, no postnasal drip  Neck: No JVD, no stridor  Lungs: No use of accessory muscles, bibasilar inspiratory crackles more on the left than on the right.  No wheezes  Cardiovascular: RRR, heart sounds normal, no murmur or gallops, trace ankle edema  Musculoskeletal: No deformities, no cyanosis or clubbing  Neuro: alert, non focal  Skin: Warm, no lesions or rashes      Assessment & Plan:  Obstructive sleep apnea (adult) (pediatric) We confirmed your CPAP compliance and clinical benefit today.  Continue to work to optimize your mask fit so you can reliably wear your CPAP for greater than 4 hours every night.  COPD (chronic obstructive pulmonary disease) (HCC) Continue Stiolto 2 puffs once daily Keep albuterol  available use 2 puffs if needed for shortness of breath, chest tightness, wheezing.  Chronic respiratory failure with hypoxia (HCC) Continue your oxygen  at 3-4 L/min  Sarcoidosis We are planning to repeat your CT scan of the chest in August 2025 to follow your sarcoidosis. Follow with Dr. Baldwin Levee in September so we can review your CT scan at that  time.  Please call sooner if you have any problems.      Racheal Buddle, MD, PhD 11/05/2023, 10:21 AM [facility] [phone] or if no answer [phone]

## 2023-11-05 NOTE — Assessment & Plan Note (Signed)
 We confirmed your CPAP compliance and clinical benefit today.  Continue to work to optimize your mask fit so you can reliably wear your CPAP for greater than 4 hours every night.

## 2023-11-05 NOTE — Assessment & Plan Note (Signed)
 We are planning to repeat your CT scan of the chest in August 2025 to follow your sarcoidosis. Follow with Dr. Baldwin Levee in September so we can review your CT scan at that time.  Please call sooner if you have any problems.

## 2023-11-05 NOTE — Patient Instructions (Addendum)
 We confirmed your CPAP compliance and clinical benefit today.  Continue to work to optimize your mask fit so you can reliably wear your CPAP for greater than 4 hours every night. Continue your oxygen  at 3-4 L/min Continue Stiolto 2 puffs once daily Keep albuterol  available use 2 puffs if needed for shortness of breath, chest tightness, wheezing. We are planning to repeat your CT scan of the chest in August 2025 to follow your sarcoidosis. Follow with Dr. Baldwin Levee in September so we can review your CT scan at that time.  Please call sooner if you have any problems.

## 2023-11-05 NOTE — Assessment & Plan Note (Signed)
Continue your oxygen at 3-4 L/min 

## 2023-11-06 DIAGNOSIS — G4733 Obstructive sleep apnea (adult) (pediatric): Secondary | ICD-10-CM | POA: Diagnosis not present

## 2023-11-12 IMAGING — CT CT HEART MORP W/ CTA COR W/ SCORE W/ CA W/CM &/OR W/O CM
4 of 7 series · 8 of 20 positions shown, 9 images · IV contrast (omnipaque)
Comparison: December 03, 2020.
COMPARISON: December 03, 2020.

Addendum:
EXAM:
OVER-READ INTERPRETATION  CT CHEST

The following report is a limited chest CT over-read performed by
12/10/2021. The coronary calcium score and coronary CT angiography
interpretation by the cardiologist is attached.
HISTORY: Chest pain, nonspecific abnormal stress test
Cardiac/Coronary CT
TECHNIQUE: The patient was scanned on a Siemens Force scanner.
PROTOCOL: A 110 kV prospective scan was triggered in the descending thoracic
aorta at 111 HU's. Axial non-contrast 3 mm slices were carried out
through the heart. The data set was analyzed on a dedicated work
station and scored using the Agatston method. Gantry rotation speed
was 250 msecs and collimation was .6 mm. Heart rate was optimized
medically and sl NTG was given. The 3D data set was reconstructed in
5% intervals of the 35-75 % of the R-R cycle. Systolic and diastolic
phases were analyzed on a dedicated work station using MPR, MIP and
VRT modes. The patient received 100mL OMNIPAQUE IOHEXOL 350 MG/ML
SOLN of contrast.

[Series 7: best diast · axial · 0.39mm/px · z∈[-95,-58]mm · 2 of 276 slices shown, 3 images]
[im 92/276  vessel]
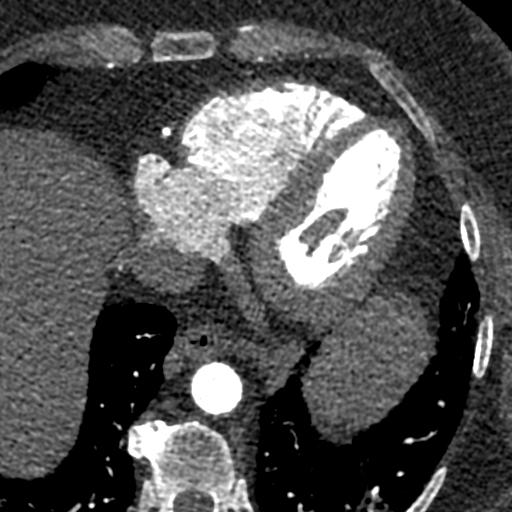
[im 92/276  lung]
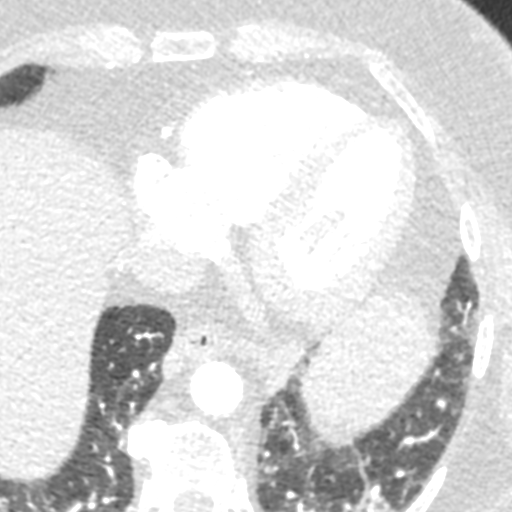
[im 184/276  vessel]
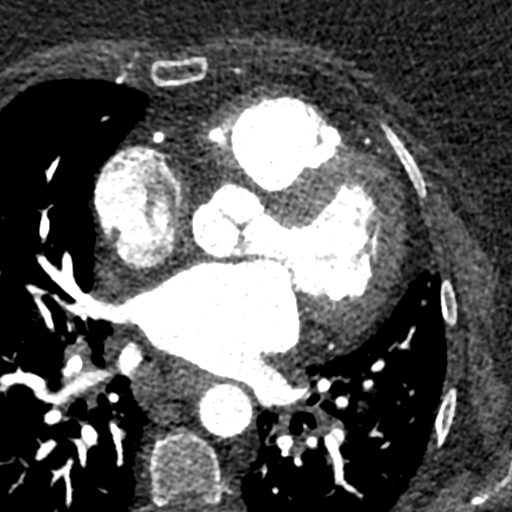

[Series 8: best syst · axial · 0.39mm/px · z∈[-95,-58]mm · 2 of 276 slices shown]
[im 92/276  vessel]
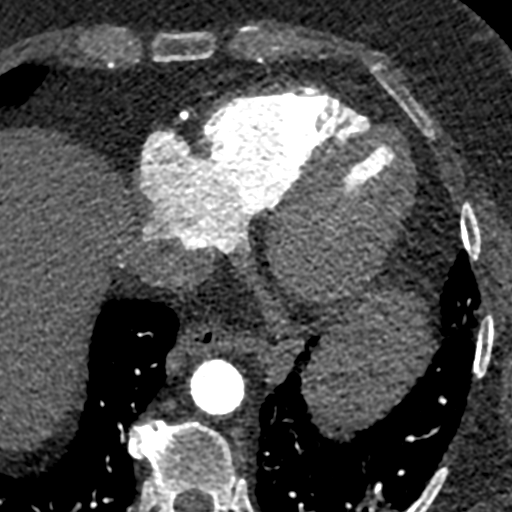
[im 184/276  vessel]
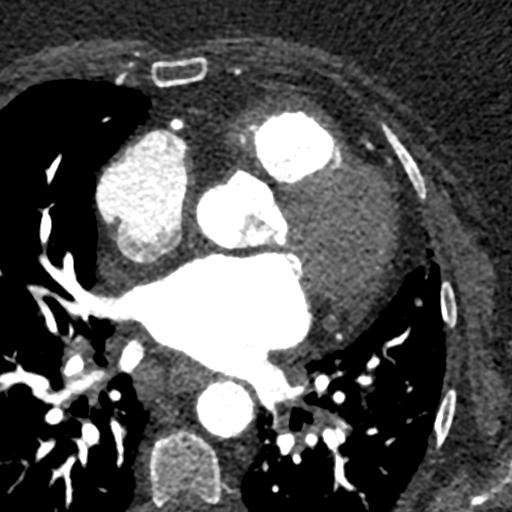

[Series 9: ts diast · axial · 0.39mm/px · z∈[-95,-58]mm · 2 of 276 slices shown]
[im 92/276  vessel]
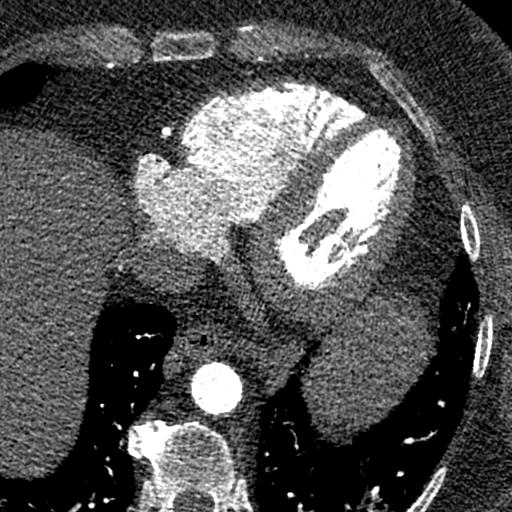
[im 184/276  vessel]
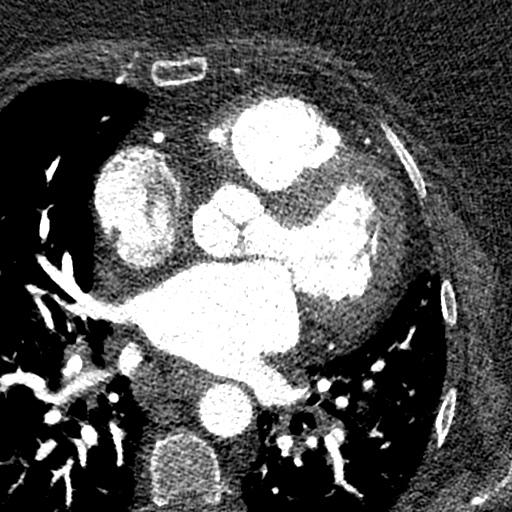

[Series 10: ts syst · axial · 0.39mm/px · z∈[-95,-58]mm · 2 of 276 slices shown]
[im 92/276  vessel]
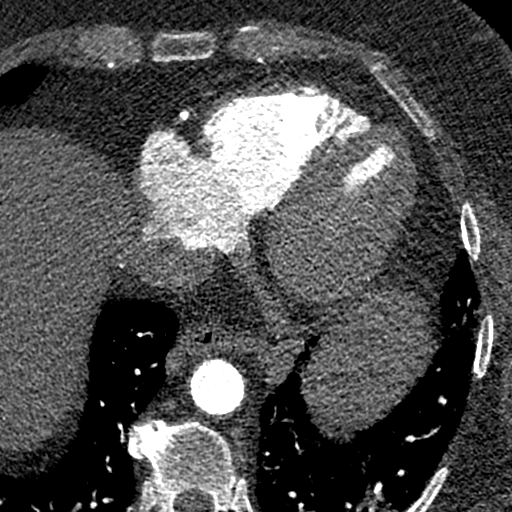
[im 184/276  vessel]
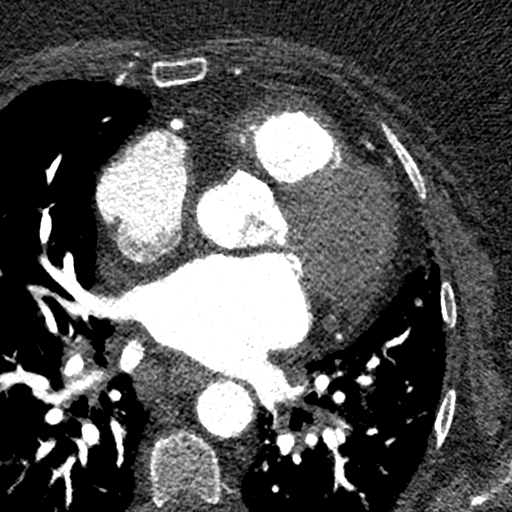

[8 of 20 positions shown; findings below may reference images not displayed]

FINDINGS: Vascular: Please see dedicated report for cardiovascular details.
Dilated main pulmonary artery up to 3.5 cm.

Mediastinum/Nodes: No acute process in the visualized portions of
the mediastinum. Mild nodal enlargement in the chest is slightly
increased from previous imaging largest is a 15 mm lymph node in the
subcarinal region previously proximally 13 mm to 14 mm short axis.
LEFT juxta hilar lymph nodes largest approximately 10 mm previously
6-7 mm. Scattered smaller lymph nodes elsewhere in the chest, for
instance adjacent to the distal esophagus on image 40/12 largest 11
mm previously 8 mm. Fullness of RIGHT hilar nodal tissue is present
not as well evaluated on the prior study.

Lungs/Pleura: Basilar atelectasis. No effusion. No consolidative
changes. Visualized airways are patent.

Upper Abdomen: No acute findings in the upper abdomen.

Musculoskeletal: No acute musculoskeletal process to the extent
evaluated. Spinal degenerative changes.
IMPRESSION: 1. Slight interval increase in size of mediastinal and hilar lymph
nodes, nonspecific but reportedly the patient has a history of
sarcoidosis. Findings could certainly be seen in the setting of
sarcoidosis. Attention on follow-up.
2. Dilated main pulmonary artery up to 3.5 cm, can be seen in the
setting of pulmonary arterial hypertension.
FINDINGS: Coronary calcium score: The patient's coronary artery calcium score
is 0, which places the patient in the 0 percentile.

Coronary arteries: Normal coronary origins.  Right dominance.

Right Coronary Artery: Normal caliber vessel, gives rise to PDA. No
significant plaque or stenosis.

Left Main Coronary Artery: Normal caliber vessel. No significant
plaque or stenosis.

Left Anterior Descending Coronary Artery: Normal caliber vessel. No
significant plaque or stenosis. Gives rise to 1 normal and 2 small
diagonal branches.

Left Circumflex Artery: Normal caliber vessel. No significant plaque
or stenosis. Gives rise to 1 small OM branch.

Aorta: Normal size, 31 mm at the mid ascending aorta (level of the
PA bifurcation) measured double oblique. No aortic atherosclerosis.
No dissection seen in visualized portions of the aorta.

Aortic Valve: No calcifications. Trileaflet.

Other findings:

Normal pulmonary vein drainage into the left atrium.

Normal left atrial appendage without a thrombus.

Enlarged size of the pulmonary artery.

Normal appearance of the pericardium.

Significant signal to noise artifact.
IMPRESSION: 1. No evidence of CAD, CADRADS = 0.

2. Coronary calcium score of 0. This was 0 percentile for age and
sex matched control.

3. Normal coronary origin with right dominance.

4. Enlarged pulmonary artery, 36 mm.

INTERPRETATION:

1. CAD-RADS 0: No evidence of CAD (0%). Consider non-atherosclerotic
causes of chest pain.

2. CAD-RADS 1: Minimal non-obstructive CAD (0-24%). Consider
non-atherosclerotic causes of chest pain. Consider preventive
therapy and risk factor modification.

3. CAD-RADS 2: Mild non-obstructive CAD (25-49%). Consider
non-atherosclerotic causes of chest pain. Consider preventive
therapy and risk factor modification.

4. CAD-RADS 3: Moderate stenosis (50-69%). Consider symptom-guided
anti-ischemic pharmacotherapy as well as risk factor modification
per guideline directed care. Additional analysis with CT FFR will be
submitted.

5. CAD-RADS 4: Severe stenosis. (70-99% or > 50% left main). Cardiac
catheterization or CT FFR is recommended. Consider symptom-guided
anti-ischemic pharmacotherapy as well as risk factor modification
per guideline directed care. Invasive coronary angiography
recommended with revascularization per published guideline
statements.

6. CAD-RADS 5: Total coronary occlusion (100%). Consider cardiac
catheterization or viability assessment. Consider symptom-guided
anti-ischemic pharmacotherapy as well as risk factor modification
per guideline directed care.

7. CAD-RADS N: Non-diagnostic study. Obstructive CAD can't be
excluded. Alternative evaluation is recommended.

*** End of Addendum ***
EXAM:
OVER-READ INTERPRETATION  CT CHEST

The following report is a limited chest CT over-read performed by
12/10/2021. The coronary calcium score and coronary CT angiography
interpretation by the cardiologist is attached.
FINDINGS: Vascular: Please see dedicated report for cardiovascular details.
Dilated main pulmonary artery up to 3.5 cm.

Mediastinum/Nodes: No acute process in the visualized portions of
the mediastinum. Mild nodal enlargement in the chest is slightly
increased from previous imaging largest is a 15 mm lymph node in the
subcarinal region previously proximally 13 mm to 14 mm short axis.
LEFT juxta hilar lymph nodes largest approximately 10 mm previously
6-7 mm. Scattered smaller lymph nodes elsewhere in the chest, for
instance adjacent to the distal esophagus on image 40/12 largest 11
mm previously 8 mm. Fullness of RIGHT hilar nodal tissue is present
not as well evaluated on the prior study.

Lungs/Pleura: Basilar atelectasis. No effusion. No consolidative
changes. Visualized airways are patent.

Upper Abdomen: No acute findings in the upper abdomen.

Musculoskeletal: No acute musculoskeletal process to the extent
evaluated. Spinal degenerative changes.
IMPRESSION: 1. Slight interval increase in size of mediastinal and hilar lymph
nodes, nonspecific but reportedly the patient has a history of
sarcoidosis. Findings could certainly be seen in the setting of
sarcoidosis. Attention on follow-up.
2. Dilated main pulmonary artery up to 3.5 cm, can be seen in the
setting of pulmonary arterial hypertension.

## 2023-11-13 DIAGNOSIS — R0689 Other abnormalities of breathing: Secondary | ICD-10-CM | POA: Diagnosis not present

## 2023-11-13 DIAGNOSIS — G4733 Obstructive sleep apnea (adult) (pediatric): Secondary | ICD-10-CM | POA: Diagnosis not present

## 2023-12-02 ENCOUNTER — Telehealth (HOSPITAL_BASED_OUTPATIENT_CLINIC_OR_DEPARTMENT_OTHER): Payer: Self-pay | Admitting: *Deleted

## 2023-12-02 DIAGNOSIS — I700 Atherosclerosis of aorta: Secondary | ICD-10-CM | POA: Diagnosis not present

## 2023-12-02 DIAGNOSIS — I129 Hypertensive chronic kidney disease with stage 1 through stage 4 chronic kidney disease, or unspecified chronic kidney disease: Secondary | ICD-10-CM | POA: Diagnosis not present

## 2023-12-02 DIAGNOSIS — E1122 Type 2 diabetes mellitus with diabetic chronic kidney disease: Secondary | ICD-10-CM | POA: Diagnosis not present

## 2023-12-02 DIAGNOSIS — K219 Gastro-esophageal reflux disease without esophagitis: Secondary | ICD-10-CM | POA: Diagnosis not present

## 2023-12-02 DIAGNOSIS — D860 Sarcoidosis of lung: Secondary | ICD-10-CM | POA: Diagnosis not present

## 2023-12-02 DIAGNOSIS — E782 Mixed hyperlipidemia: Secondary | ICD-10-CM | POA: Diagnosis not present

## 2023-12-02 DIAGNOSIS — E034 Atrophy of thyroid (acquired): Secondary | ICD-10-CM | POA: Diagnosis not present

## 2023-12-02 DIAGNOSIS — N1831 Chronic kidney disease, stage 3a: Secondary | ICD-10-CM | POA: Diagnosis not present

## 2023-12-02 DIAGNOSIS — J449 Chronic obstructive pulmonary disease, unspecified: Secondary | ICD-10-CM | POA: Diagnosis not present

## 2023-12-02 NOTE — Telephone Encounter (Signed)
 Copied from CRM [id number]. Topic: Clinical - Request for Lab/Test Order >> Dec 02, 2023  1:58 PM Juliana Ocean wrote: Reason for CRM: pt got a call to schedule her CT.   However, the dr put location as the [facility]. Pt states she prefers to go to [facility].  That is where she has gone every time.  Can you please change this order?

## 2023-12-03 NOTE — Telephone Encounter (Signed)
 Spoke to central scheduling for Mackenzie Johnson    Pt is scheduled for 6/12 Thursday 11:45am arrive at 11:15am     Attempted to speak with the pt, called pt twice and unable to get through to her - unable to leave vm

## 2023-12-07 DIAGNOSIS — H40011 Open angle with borderline findings, low risk, right eye: Secondary | ICD-10-CM | POA: Diagnosis not present

## 2023-12-07 DIAGNOSIS — H401122 Primary open-angle glaucoma, left eye, moderate stage: Secondary | ICD-10-CM | POA: Diagnosis not present

## 2023-12-07 DIAGNOSIS — E119 Type 2 diabetes mellitus without complications: Secondary | ICD-10-CM | POA: Diagnosis not present

## 2023-12-07 DIAGNOSIS — G4733 Obstructive sleep apnea (adult) (pediatric): Secondary | ICD-10-CM | POA: Diagnosis not present

## 2023-12-07 DIAGNOSIS — H43821 Vitreomacular adhesion, right eye: Secondary | ICD-10-CM | POA: Diagnosis not present

## 2023-12-09 ENCOUNTER — Telehealth: Payer: Self-pay

## 2023-12-09 NOTE — Telephone Encounter (Signed)
 Copied from CRM [id number]. Topic: Clinical - Request for Lab/Test Order >> Dec 08, 2023  8:31 AM Isabell A wrote: Reason for CRM: Lucio Sabin from [facility] hospital states the patient is scheduled for a CT scan tomorrow 6/12 at 11:15am, they still have not received the order. Requesting order to be sent to fax no: [phone]  ATC x1 unable to leave a voicemail on patient's phone .

## 2023-12-14 DIAGNOSIS — G4733 Obstructive sleep apnea (adult) (pediatric): Secondary | ICD-10-CM | POA: Diagnosis not present

## 2023-12-14 DIAGNOSIS — R0689 Other abnormalities of breathing: Secondary | ICD-10-CM | POA: Diagnosis not present

## 2023-12-20 DIAGNOSIS — K219 Gastro-esophageal reflux disease without esophagitis: Secondary | ICD-10-CM | POA: Diagnosis not present

## 2023-12-20 DIAGNOSIS — K760 Fatty (change of) liver, not elsewhere classified: Secondary | ICD-10-CM | POA: Diagnosis not present

## 2024-01-04 DIAGNOSIS — G4733 Obstructive sleep apnea (adult) (pediatric): Secondary | ICD-10-CM | POA: Diagnosis not present

## 2024-01-06 DIAGNOSIS — G4733 Obstructive sleep apnea (adult) (pediatric): Secondary | ICD-10-CM | POA: Diagnosis not present

## 2024-01-13 DIAGNOSIS — G4733 Obstructive sleep apnea (adult) (pediatric): Secondary | ICD-10-CM | POA: Diagnosis not present

## 2024-01-13 DIAGNOSIS — R0689 Other abnormalities of breathing: Secondary | ICD-10-CM | POA: Diagnosis not present

## 2024-02-01 DIAGNOSIS — Z79899 Other long term (current) drug therapy: Secondary | ICD-10-CM | POA: Diagnosis not present

## 2024-02-01 DIAGNOSIS — D126 Benign neoplasm of colon, unspecified: Secondary | ICD-10-CM | POA: Diagnosis not present

## 2024-02-01 DIAGNOSIS — G4733 Obstructive sleep apnea (adult) (pediatric): Secondary | ICD-10-CM | POA: Diagnosis not present

## 2024-02-01 DIAGNOSIS — K6389 Other specified diseases of intestine: Secondary | ICD-10-CM | POA: Diagnosis not present

## 2024-02-01 DIAGNOSIS — K219 Gastro-esophageal reflux disease without esophagitis: Secondary | ICD-10-CM | POA: Diagnosis not present

## 2024-02-01 DIAGNOSIS — K648 Other hemorrhoids: Secondary | ICD-10-CM | POA: Diagnosis not present

## 2024-02-01 DIAGNOSIS — E785 Hyperlipidemia, unspecified: Secondary | ICD-10-CM | POA: Diagnosis not present

## 2024-02-01 DIAGNOSIS — N183 Chronic kidney disease, stage 3 unspecified: Secondary | ICD-10-CM | POA: Diagnosis not present

## 2024-02-01 DIAGNOSIS — I2510 Atherosclerotic heart disease of native coronary artery without angina pectoris: Secondary | ICD-10-CM | POA: Diagnosis not present

## 2024-02-01 DIAGNOSIS — I10 Essential (primary) hypertension: Secondary | ICD-10-CM | POA: Diagnosis not present

## 2024-02-01 DIAGNOSIS — Z87891 Personal history of nicotine dependence: Secondary | ICD-10-CM | POA: Diagnosis not present

## 2024-02-01 DIAGNOSIS — E1122 Type 2 diabetes mellitus with diabetic chronic kidney disease: Secondary | ICD-10-CM | POA: Diagnosis not present

## 2024-02-01 DIAGNOSIS — H409 Unspecified glaucoma: Secondary | ICD-10-CM | POA: Diagnosis not present

## 2024-02-01 DIAGNOSIS — Z7982 Long term (current) use of aspirin: Secondary | ICD-10-CM | POA: Diagnosis not present

## 2024-02-01 DIAGNOSIS — D869 Sarcoidosis, unspecified: Secondary | ICD-10-CM | POA: Diagnosis not present

## 2024-02-01 DIAGNOSIS — D124 Benign neoplasm of descending colon: Secondary | ICD-10-CM | POA: Diagnosis not present

## 2024-02-01 DIAGNOSIS — J4489 Other specified chronic obstructive pulmonary disease: Secondary | ICD-10-CM | POA: Diagnosis not present

## 2024-02-01 DIAGNOSIS — Z7985 Long-term (current) use of injectable non-insulin antidiabetic drugs: Secondary | ICD-10-CM | POA: Diagnosis not present

## 2024-02-01 DIAGNOSIS — Z8673 Personal history of transient ischemic attack (TIA), and cerebral infarction without residual deficits: Secondary | ICD-10-CM | POA: Diagnosis not present

## 2024-02-01 DIAGNOSIS — Z860100 Personal history of colon polyps, unspecified: Secondary | ICD-10-CM | POA: Diagnosis not present

## 2024-02-01 DIAGNOSIS — R1310 Dysphagia, unspecified: Secondary | ICD-10-CM | POA: Diagnosis not present

## 2024-02-01 DIAGNOSIS — I129 Hypertensive chronic kidney disease with stage 1 through stage 4 chronic kidney disease, or unspecified chronic kidney disease: Secondary | ICD-10-CM | POA: Diagnosis not present

## 2024-02-01 DIAGNOSIS — K635 Polyp of colon: Secondary | ICD-10-CM | POA: Diagnosis not present

## 2024-02-01 DIAGNOSIS — D509 Iron deficiency anemia, unspecified: Secondary | ICD-10-CM | POA: Diagnosis not present

## 2024-02-02 ENCOUNTER — Ambulatory Visit (HOSPITAL_BASED_OUTPATIENT_CLINIC_OR_DEPARTMENT_OTHER)
Admission: RE | Admit: 2024-02-02 | Discharge: 2024-02-02 | Disposition: A | Discharge status: Home / Self Care | Source: Ambulatory Visit | Attending: Emergency Medicine | Admitting: Emergency Medicine

## 2024-02-02 DIAGNOSIS — D869 Sarcoidosis, unspecified: Secondary | ICD-10-CM | POA: Insufficient documentation

## 2024-02-02 DIAGNOSIS — I700 Atherosclerosis of aorta: Secondary | ICD-10-CM | POA: Diagnosis not present

## 2024-02-02 DIAGNOSIS — R918 Other nonspecific abnormal finding of lung field: Secondary | ICD-10-CM | POA: Diagnosis not present

## 2024-02-06 DIAGNOSIS — G4733 Obstructive sleep apnea (adult) (pediatric): Secondary | ICD-10-CM | POA: Diagnosis not present

## 2024-02-13 DIAGNOSIS — R0689 Other abnormalities of breathing: Secondary | ICD-10-CM | POA: Diagnosis not present

## 2024-02-13 DIAGNOSIS — G4733 Obstructive sleep apnea (adult) (pediatric): Secondary | ICD-10-CM | POA: Diagnosis not present

## 2024-03-01 ENCOUNTER — Ambulatory Visit (INDEPENDENT_AMBULATORY_CARE_PROVIDER_SITE_OTHER): Admitting: Emergency Medicine

## 2024-03-01 ENCOUNTER — Encounter: Payer: Self-pay | Admitting: Emergency Medicine

## 2024-03-01 VITALS — BP 105/70 | HR 71 | Ht <= 58 in | Wt 184.0 lb

## 2024-03-01 DIAGNOSIS — K635 Polyp of colon: Secondary | ICD-10-CM

## 2024-03-01 DIAGNOSIS — G4733 Obstructive sleep apnea (adult) (pediatric): Secondary | ICD-10-CM | POA: Diagnosis not present

## 2024-03-01 DIAGNOSIS — D869 Sarcoidosis, unspecified: Secondary | ICD-10-CM | POA: Diagnosis not present

## 2024-03-01 DIAGNOSIS — J9611 Chronic respiratory failure with hypoxia: Secondary | ICD-10-CM | POA: Diagnosis not present

## 2024-03-01 DIAGNOSIS — J301 Allergic rhinitis due to pollen: Secondary | ICD-10-CM | POA: Diagnosis not present

## 2024-03-01 DIAGNOSIS — J449 Chronic obstructive pulmonary disease, unspecified: Secondary | ICD-10-CM

## 2024-03-01 NOTE — Assessment & Plan Note (Signed)
 Confirm good compliance today with her CPAP download.  She reported good clinical benefit although she does deal with some leak that can sometimes wake her at night.  Overall better sleep quality, less daytime sleepiness.

## 2024-03-01 NOTE — Progress Notes (Signed)
 Subjective:    Patient ID: Mackenzie Johnson, female    DOB: 1961-01-31, 63 y.o.   MRN: [id number]  HPI:   ROV 11/05/2023 --Mackenzie Johnson is 63 and has a history of sarcoidosis, associated COPD, obesity with OSA/OHS.  She has chronic hypoxemic respiratory failure due to all the above.  She underwent surgical resection of a large colonic polyp 10/25/2023.  She had previously been quite reliable with her CPAP but then had trouble with her device which impacted her compliance.  When I saw her in April we ordered the new device and confirmed that she would receive.  She reports today that she was able to get her CPAP machine. She is using it. She has a small full face mask. She does deal w some leakage, machine noise. She sometimes removes due to noise and leakage, feels that she wil be able to wear better when she is able to get back to sleeping on her side.  Compliance download available for/9/25 through 11/04/2023 shows 90% compliance, 60% of the time for greater than 4 hours.  She is on AutoSet 10-20 cm water.  She reports good clinical benefit.  ROV 03/01/2024 --Mackenzie Johnson is a 63 year old woman with a history of obesity and OSA/OHS, also sarcoidosis.  She has chronic hypoxemic respiratory failure due to the above.  Recent medical history also significant for surgical resection of a large colonic polyp April 2025.  She has had some difficulty with CPAP compliance.  Today she reports that she is dealing with the CPAP leak, wearing more reliably. Her O2 is at 3-4L/min depending on her level of exertion. She is on stiolto. Occasional albuterol  use.   CPAP compliance data 8//25 through 02/29/2024 shows usage for greater than 4 hours on 97% of the days.  She is on AutoSet device 10-20 cmH2O with typical pressure 13 cmH2O and minimal leak.  Good control of her apneic events.  CT chest 02/02/24 revierwd by me shows stable scattered GG infiltrates, no change. No adenopathy.    Review of Systems  Genitourinary:  Positive for  genital sores.   As per HPI     Objective:   Physical Exam Vitals:   03/01/24 0905  BP: 105/70  Pulse: 71  SpO2: 97%  Weight: 184 lb (83.5 kg)  Height: 4' 9 (1.448 m)    Gen: Pleasant, obese, in no distress,  normal affect  ENT: No lesions,  mouth clear,  oropharynx clear, no postnasal drip  Neck: No JVD, no stridor  Lungs: No use of accessory muscles, bibasilar inspiratory crackles more on the left than on the right.  No wheezes  Cardiovascular: RRR, heart sounds normal, no murmur or gallops, trace ankle edema  Musculoskeletal: No deformities, no cyanosis or clubbing  Neuro: alert, non focal  Skin: Warm, no lesions or rashes      Assessment & Plan:  Sarcoidosis CT chest stable.  Plan to continue to follow, will determine timing of repeat scanning at a future visit.  COPD (chronic obstructive pulmonary disease) (HCC) Continue current regimen, Stiolto and albuterol  as needed  Chronic respiratory failure with hypoxia (HCC) Confirmed good compliance.  She is using 3-4 L/min depending on her level of exertion  Obstructive sleep apnea (adult) (pediatric) Confirm good compliance today with her CPAP download.  She reported good clinical benefit although she does deal with some leak that can sometimes wake her at night.  Overall better sleep quality, less daytime sleepiness.       Lamar Chris, MD, PhD  03/01/2024, 9:51 AM [facility] [phone] or if no answer [phone]

## 2024-03-01 NOTE — Assessment & Plan Note (Signed)
 Confirmed good compliance.  She is using 3-4 L/min depending on her level of exertion

## 2024-03-01 NOTE — Assessment & Plan Note (Signed)
 CT chest stable.  Plan to continue to follow, will determine timing of repeat scanning at a future visit.

## 2024-03-01 NOTE — Patient Instructions (Signed)
 We reviewed your CT scan of the chest today.  This is stable compared with priors.  Good news. Please continue your Stiolto 2 puffs once daily. Keep your albuterol  available to use 2 puffs when needed for shortness of breath, chest tightness, wheezing. Continue your oxygen  at 3-4 L/min depending on your level of exertion. Congratulations on wearing your CPAP reliably every night.  Please keep up the good work.  It is important for your overall health to continue to use this reliably. Follow with Dr Shelah in 6 months or sooner if you have any problems

## 2024-03-01 NOTE — Assessment & Plan Note (Signed)
 Continue current regimen, Stiolto and albuterol  as needed

## 2024-03-15 DIAGNOSIS — G4733 Obstructive sleep apnea (adult) (pediatric): Secondary | ICD-10-CM | POA: Diagnosis not present

## 2024-03-19 ENCOUNTER — Other Ambulatory Visit: Payer: Self-pay | Admitting: Emergency Medicine

## 2024-03-23 DIAGNOSIS — I129 Hypertensive chronic kidney disease with stage 1 through stage 4 chronic kidney disease, or unspecified chronic kidney disease: Secondary | ICD-10-CM | POA: Diagnosis not present

## 2024-03-23 DIAGNOSIS — E782 Mixed hyperlipidemia: Secondary | ICD-10-CM | POA: Diagnosis not present

## 2024-03-23 DIAGNOSIS — E034 Atrophy of thyroid (acquired): Secondary | ICD-10-CM | POA: Diagnosis not present

## 2024-03-23 DIAGNOSIS — N1831 Chronic kidney disease, stage 3a: Secondary | ICD-10-CM | POA: Diagnosis not present

## 2024-03-23 DIAGNOSIS — I700 Atherosclerosis of aorta: Secondary | ICD-10-CM | POA: Diagnosis not present

## 2024-03-23 DIAGNOSIS — D860 Sarcoidosis of lung: Secondary | ICD-10-CM | POA: Diagnosis not present

## 2024-03-23 DIAGNOSIS — K219 Gastro-esophageal reflux disease without esophagitis: Secondary | ICD-10-CM | POA: Diagnosis not present

## 2024-03-23 DIAGNOSIS — B372 Candidiasis of skin and nail: Secondary | ICD-10-CM | POA: Diagnosis not present

## 2024-03-23 DIAGNOSIS — J449 Chronic obstructive pulmonary disease, unspecified: Secondary | ICD-10-CM | POA: Diagnosis not present

## 2024-03-23 DIAGNOSIS — E1122 Type 2 diabetes mellitus with diabetic chronic kidney disease: Secondary | ICD-10-CM | POA: Diagnosis not present

## 2024-03-25 DIAGNOSIS — R0689 Other abnormalities of breathing: Secondary | ICD-10-CM | POA: Diagnosis not present

## 2024-03-25 DIAGNOSIS — J449 Chronic obstructive pulmonary disease, unspecified: Secondary | ICD-10-CM | POA: Diagnosis not present

## 2024-04-07 DIAGNOSIS — G4733 Obstructive sleep apnea (adult) (pediatric): Secondary | ICD-10-CM | POA: Diagnosis not present

## 2024-04-14 DIAGNOSIS — G4733 Obstructive sleep apnea (adult) (pediatric): Secondary | ICD-10-CM | POA: Diagnosis not present

## 2024-04-14 DIAGNOSIS — R0689 Other abnormalities of breathing: Secondary | ICD-10-CM | POA: Diagnosis not present

## 2024-07-12 NOTE — Progress Notes (Signed)
 Mackenzie Johnson                                          MRN: [id number]   07/12/2024   The [facility] Quality Team Specialist reviewed this patient medical record for the purposes of chart review for care gap closure. The following were reviewed: abstraction for care gap closure-glycemic status assessment.    [facility] Quality Team

## 2024-08-30 ENCOUNTER — Ambulatory Visit: Admitting: Emergency Medicine
